# Patient Record
Sex: Female | Born: 1965 | Race: White | Hispanic: No | Marital: Married | Smoking: Former smoker
Health system: Southern US, Community
[De-identification: ages and names within clinical notes are randomized; demographics above are authoritative.]

## PROBLEM LIST (undated history)

## (undated) DIAGNOSIS — Z1211 Encounter for screening for malignant neoplasm of colon: Secondary | ICD-10-CM

## (undated) DIAGNOSIS — E669 Obesity, unspecified: Secondary | ICD-10-CM

## (undated) DIAGNOSIS — IMO0001 Reserved for inherently not codable concepts without codable children: Secondary | ICD-10-CM

## (undated) DIAGNOSIS — R87619 Unspecified abnormal cytological findings in specimens from cervix uteri: Secondary | ICD-10-CM

## (undated) DIAGNOSIS — I10 Essential (primary) hypertension: Secondary | ICD-10-CM

## (undated) DIAGNOSIS — Z1239 Encounter for other screening for malignant neoplasm of breast: Secondary | ICD-10-CM

## (undated) DIAGNOSIS — R42 Dizziness and giddiness: Secondary | ICD-10-CM

## (undated) DIAGNOSIS — E538 Deficiency of other specified B group vitamins: Secondary | ICD-10-CM

## (undated) DIAGNOSIS — E559 Vitamin D deficiency, unspecified: Secondary | ICD-10-CM

## (undated) DIAGNOSIS — M1990 Unspecified osteoarthritis, unspecified site: Secondary | ICD-10-CM

## (undated) DIAGNOSIS — J343 Hypertrophy of nasal turbinates: Secondary | ICD-10-CM

## (undated) DIAGNOSIS — R030 Elevated blood-pressure reading, without diagnosis of hypertension: Secondary | ICD-10-CM

## (undated) DIAGNOSIS — M5412 Radiculopathy, cervical region: Secondary | ICD-10-CM

## (undated) HISTORY — DX: Reserved for inherently not codable concepts without codable children: IMO0001

## (undated) HISTORY — DX: Encounter for screening for malignant neoplasm of colon: Z12.11

## (undated) HISTORY — PX: WISDOM TOOTH EXTRACTION: SHX21

## (undated) HISTORY — DX: Encounter for other screening for malignant neoplasm of breast: Z12.39

## (undated) HISTORY — DX: Radiculopathy, cervical region: M54.12

## (undated) HISTORY — DX: Unspecified abnormal cytological findings in specimens from cervix uteri: R87.619

## (undated) HISTORY — DX: Hypertrophy of nasal turbinates: J34.3

## (undated) HISTORY — DX: Obesity, unspecified: E66.9

## (undated) HISTORY — DX: Deficiency of other specified B group vitamins: E53.8

## (undated) HISTORY — DX: Essential (primary) hypertension: I10

## (undated) HISTORY — DX: Elevated blood-pressure reading, without diagnosis of hypertension: R03.0

## (undated) HISTORY — DX: Vitamin D deficiency, unspecified: E55.9

---

## 1987-03-24 HISTORY — PX: DILATION AND CURETTAGE OF UTERUS: SHX78

## 1993-03-23 HISTORY — PX: TONSILLECTOMY: SHX5217

## 1995-03-24 HISTORY — PX: HAND SURGERY: SHX662

## 2004-03-18 ENCOUNTER — Other Ambulatory Visit: Admission: RE | Admit: 2004-03-18 | Discharge: 2004-03-18 | Payer: Self-pay | Admitting: Family Medicine

## 2005-03-30 ENCOUNTER — Other Ambulatory Visit: Admission: RE | Admit: 2005-03-30 | Discharge: 2005-03-30 | Payer: Self-pay | Admitting: Family Medicine

## 2006-04-01 ENCOUNTER — Other Ambulatory Visit: Admission: RE | Admit: 2006-04-01 | Discharge: 2006-04-01 | Payer: Self-pay | Admitting: Family Medicine

## 2006-04-26 ENCOUNTER — Encounter: Admission: RE | Admit: 2006-04-26 | Discharge: 2006-04-26 | Payer: Self-pay | Admitting: Family Medicine

## 2007-05-02 ENCOUNTER — Encounter: Admission: RE | Admit: 2007-05-02 | Discharge: 2007-05-02 | Payer: Self-pay | Admitting: Family Medicine

## 2007-05-13 ENCOUNTER — Encounter: Admission: RE | Admit: 2007-05-13 | Discharge: 2007-05-13 | Payer: Self-pay | Admitting: Family Medicine

## 2010-04-13 ENCOUNTER — Encounter: Payer: Self-pay | Admitting: Family Medicine

## 2011-05-22 ENCOUNTER — Other Ambulatory Visit (HOSPITAL_COMMUNITY)
Admission: RE | Admit: 2011-05-22 | Discharge: 2011-05-22 | Disposition: A | Discharge status: Home / Self Care | Payer: 59 | Source: Ambulatory Visit | Attending: Family Medicine | Admitting: Family Medicine

## 2011-05-22 ENCOUNTER — Other Ambulatory Visit: Payer: Self-pay | Admitting: Family Medicine

## 2011-05-22 DIAGNOSIS — Z124 Encounter for screening for malignant neoplasm of cervix: Secondary | ICD-10-CM | POA: Insufficient documentation

## 2013-08-07 ENCOUNTER — Ambulatory Visit: Payer: Self-pay

## 2013-11-03 ENCOUNTER — Ambulatory Visit: Payer: Self-pay | Admitting: Family Medicine

## 2013-11-03 LAB — HM MAMMOGRAPHY

## 2014-03-08 ENCOUNTER — Ambulatory Visit: Payer: Self-pay | Admitting: Family Medicine

## 2014-03-20 ENCOUNTER — Ambulatory Visit: Payer: Self-pay | Admitting: Family Medicine

## 2014-03-22 ENCOUNTER — Ambulatory Visit: Payer: Self-pay | Admitting: Family Medicine

## 2014-05-25 ENCOUNTER — Ambulatory Visit: Payer: Self-pay | Admitting: Neurosurgery

## 2014-06-19 ENCOUNTER — Ambulatory Visit: Payer: Self-pay | Admitting: General Surgery

## 2014-06-25 ENCOUNTER — Ambulatory Visit: Payer: Self-pay | Admitting: General Surgery

## 2014-07-13 LAB — HM PAP SMEAR

## 2014-07-20 ENCOUNTER — Ambulatory Visit
Admit: 2014-07-20 | Disposition: A | Discharge status: Home / Self Care | Payer: Self-pay | Attending: Family Medicine | Admitting: Family Medicine

## 2014-08-01 ENCOUNTER — Other Ambulatory Visit: Payer: Self-pay | Admitting: Family Medicine

## 2014-08-01 DIAGNOSIS — R1011 Right upper quadrant pain: Secondary | ICD-10-CM

## 2014-08-01 DIAGNOSIS — R110 Nausea: Secondary | ICD-10-CM

## 2014-08-09 ENCOUNTER — Ambulatory Visit
Admission: RE | Admit: 2014-08-09 | Discharge: 2014-08-09 | Disposition: A | Discharge status: Home / Self Care | Payer: 59 | Source: Ambulatory Visit | Attending: Family Medicine | Admitting: Family Medicine

## 2014-08-09 DIAGNOSIS — R110 Nausea: Secondary | ICD-10-CM

## 2014-08-09 DIAGNOSIS — R1011 Right upper quadrant pain: Secondary | ICD-10-CM | POA: Diagnosis present

## 2014-08-09 HISTORY — DX: Essential (primary) hypertension: I10

## 2014-08-09 MED ORDER — SINCALIDE 5 MCG IJ SOLR
0.0200 ug/kg | Freq: Once | INTRAMUSCULAR | Status: AC
  Administered 2014-08-09: 1.7 ug via INTRAVENOUS

## 2014-08-09 MED ORDER — TECHNETIUM TC 99M MEBROFENIN IV KIT
5.0000 | PACK | Freq: Once | INTRAVENOUS | Status: AC | PRN
  Administered 2014-08-09: 4.89 via INTRAVENOUS

## 2014-08-22 ENCOUNTER — Encounter: Payer: Self-pay | Admitting: *Deleted

## 2014-09-29 ENCOUNTER — Other Ambulatory Visit: Payer: Self-pay | Admitting: Family Medicine

## 2014-12-17 ENCOUNTER — Telehealth: Payer: Self-pay

## 2014-12-17 DIAGNOSIS — Z1239 Encounter for other screening for malignant neoplasm of breast: Secondary | ICD-10-CM

## 2014-12-17 HISTORY — DX: Encounter for other screening for malignant neoplasm of breast: Z12.39

## 2014-12-17 NOTE — Telephone Encounter (Signed)
Her mammo and CPE have gotten off track. Her last CPE was back in April, but she is due for her mammogram now. Can we go ahead and order it for her?

## 2014-12-17 NOTE — Telephone Encounter (Signed)
Ordered thank you

## 2014-12-17 NOTE — Telephone Encounter (Signed)
Patient will call to sch appt

## 2014-12-21 DIAGNOSIS — E538 Deficiency of other specified B group vitamins: Secondary | ICD-10-CM | POA: Insufficient documentation

## 2014-12-21 DIAGNOSIS — IMO0001 Reserved for inherently not codable concepts without codable children: Secondary | ICD-10-CM | POA: Insufficient documentation

## 2014-12-21 DIAGNOSIS — E559 Vitamin D deficiency, unspecified: Secondary | ICD-10-CM | POA: Insufficient documentation

## 2014-12-21 DIAGNOSIS — R87619 Unspecified abnormal cytological findings in specimens from cervix uteri: Secondary | ICD-10-CM | POA: Insufficient documentation

## 2014-12-21 DIAGNOSIS — M5412 Radiculopathy, cervical region: Secondary | ICD-10-CM | POA: Insufficient documentation

## 2014-12-21 DIAGNOSIS — E669 Obesity, unspecified: Secondary | ICD-10-CM | POA: Insufficient documentation

## 2014-12-21 DIAGNOSIS — R030 Elevated blood-pressure reading, without diagnosis of hypertension: Secondary | ICD-10-CM

## 2014-12-25 ENCOUNTER — Ambulatory Visit (INDEPENDENT_AMBULATORY_CARE_PROVIDER_SITE_OTHER): Payer: 59 | Admitting: Family Medicine

## 2014-12-25 ENCOUNTER — Ambulatory Visit
Admission: RE | Admit: 2014-12-25 | Discharge: 2014-12-25 | Disposition: A | Discharge status: Home / Self Care | Payer: 59 | Source: Ambulatory Visit | Attending: Family Medicine | Admitting: Family Medicine

## 2014-12-25 ENCOUNTER — Encounter: Payer: Self-pay | Admitting: Family Medicine

## 2014-12-25 VITALS — BP 116/74 | HR 72 | Temp 99.1°F | Ht 63.5 in | Wt 186.8 lb

## 2014-12-25 DIAGNOSIS — Z1231 Encounter for screening mammogram for malignant neoplasm of breast: Secondary | ICD-10-CM | POA: Diagnosis present

## 2014-12-25 DIAGNOSIS — Z23 Encounter for immunization: Secondary | ICD-10-CM | POA: Diagnosis not present

## 2014-12-25 DIAGNOSIS — E669 Obesity, unspecified: Secondary | ICD-10-CM

## 2014-12-25 DIAGNOSIS — Z1239 Encounter for other screening for malignant neoplasm of breast: Secondary | ICD-10-CM

## 2014-12-25 NOTE — Assessment & Plan Note (Addendum)
Praised patient for her efforts at working out, trying to eat healthier; she is making good progress; counseling given on weight loss, safe rate of weight loss for her should be 1/2 to 1 pound every week; use information on familydoctor.org, "What It Takes to Lose Weight"; calorie restriction, activity; slow and steady; estimated her weight to get her out of the obesity category is 171 pounds or just under, and if she loses just 1/2 pound per week, she should reach that target in 32 weeks; her ideal weight is 143 pounds or just under

## 2014-12-25 NOTE — Patient Instructions (Signed)
Check out the information at familydoctor.org entitled "What It Takes to Lose Weight" Try to lose between 1-2 pounds per week by taking in fewer calories and burning off more calories You can succeed by limiting portions, limiting foods dense in calories and fat, becoming more active, and drinking 8 glasses of water a day Don't skip meals, especially breakfast, as skipping meals may alter your metabolism Do not use over-the-counter weight loss pills or gimmicks that claim rapid weight loss A healthy BMI (or body mass index) is between 18.5 and 24.9 You can calculate your ideal BMI at the [place] website [employer]fr Your ideal target weight is 143 pounds or just under Your target weight to get out of the obese category is 171 pounds or just under Keep up the great effort at eating healthier

## 2014-12-25 NOTE — Progress Notes (Signed)
BP 116/74 mmHg  Pulse 72  Temp(Src) 99.1 F (37.3 C)  Ht 5' 3.5" (1.613 m)  Wt 186 lb 12.8 oz (84.732 kg)  BMI 32.57 kg/m2  SpO2 100%   Subjective:    Patient ID: Wendy Li, female    DOB: 13-Aug-1965, 49 y.o.   MRN: [id number]  HPI: Wendy Li is a 49 y.o. female  Chief Complaint  Patient presents with  . Follow-up    Form to fill out for work for BMI for insurance for [facility]   She has lost down from 190 pounds to 186+ pounds today, a little over 3 pounds since her last visit She is really working on her weight and physical fitness; she wants to get a form filled out so her insurance is aware she is working on weight loss Going to the gym four days a week at least, sometimes goes five days a week She is feeling better and her back feels better Noticing muscles in her arms She says she has to be less than 30.0 BMI; she was measured at 31.8, down from 35 last year; they did her waist measurements too She is 32.57 here; she verifies that we checked her height without her shoes on They checked her blood too, glucose, A1C, cholesterol She did not have surgery or a thyroid condition She has been focusing on nutrition, eating healthier She just started working out in July She was slow going out of the gate, not 4-5 x a week, just 2-3 x a week Now doesn't have the soreness and runs the mile and does weight training, in there about an hour Eating lots of spinach, leafy greens, broccoli Cut down on tuna after our talk about mercury poisoning Side note: thinks she had a kidney stone; that's all better now; thinks she passed a kidney stone on the right side  Relevant past medical, surgical, family and social history reviewed and updated as indicated. Interim medical history since our last visit reviewed. Allergies and medications reviewed and updated.  Review of Systems  Constitutional: Negative for unexpected weight change.  Cardiovascular: Negative for  chest pain.  Endocrine: Negative for polydipsia and polyuria.  Musculoskeletal:       Little bit of tendonitis in right elbow, uses ibuprofen; feet don't hurt anymore  Neurological: Negative for dizziness.  Hematological: Does not bruise/bleed easily.   Per HPI unless specifically indicated above     Objective:    BP 116/74 mmHg  Pulse 72  Temp(Src) 99.1 F (37.3 C)  Ht 5' 3.5" (1.613 m)  Wt 186 lb 12.8 oz (84.732 kg)  BMI 32.57 kg/m2  SpO2 100%  Wt Readings from Last 3 Encounters:  12/25/14 186 lb 12.8 oz (84.732 kg)  07/13/14 190 lb (86.183 kg)  08/09/14 190 lb (86.183 kg)    Physical Exam  Constitutional: She appears well-developed and well-nourished.  Cardiovascular: Normal rate and regular rhythm.   Pulmonary/Chest: Effort normal and breath sounds normal.  Abdominal: Soft. Bowel sounds are normal. She exhibits no distension.  Musculoskeletal: She exhibits no edema.  Skin: Skin is warm and dry.  Psychiatric: She has a normal mood and affect. Her behavior is normal. Judgment and thought content normal.    Results for orders placed or performed in visit on 12/21/14  HM MAMMOGRAPHY  Result Value Ref Range   HM Mammogram per PP   HM PAP SMEAR  Result Value Ref Range   HM Pap smear per PP  Assessment & Plan:   Problem List Items Addressed This Visit      Other   Obesity - Primary    Praised patient for her efforts at working out, trying to eat healthier; she is making good progress; counseling given on weight loss, safe rate of weight loss for her should be 1/2 to 1 pound every week; use information on familydoctor.org, "What It Takes to Lose Weight"; calorie restriction, activity; slow and steady; estimated her weight to get her out of the obesity category is 171 pounds or just under, and if she loses just 1/2 pound per week, she should reach that target in 32 weeks; her ideal weight is 143 pounds or just under       Other Visit Diagnoses    Need for  influenza vaccination        flu vaccine offered and given today    Relevant Orders    Flu Vaccine QUAD 36+ mos PF IM (Fluarix & Fluzone Quad PF) (Completed)       Follow up plan: Return if symptoms worsen or fail to improve.  Face-to-face time with patient was more than 10 minutes, >50% time spent counseling and coordination of care

## 2015-03-28 ENCOUNTER — Other Ambulatory Visit: Payer: Self-pay | Admitting: Family Medicine

## 2015-03-28 NOTE — Telephone Encounter (Signed)
April 2016 creatinine and -lytes reviewed; rx approved

## 2015-07-11 ENCOUNTER — Encounter: Payer: Self-pay | Admitting: Emergency Medicine

## 2015-07-11 ENCOUNTER — Ambulatory Visit
Admission: EM | Admit: 2015-07-11 | Discharge: 2015-07-11 | Disposition: A | Discharge status: Home / Self Care | Payer: 59 | Attending: Family Medicine | Admitting: Family Medicine

## 2015-07-11 DIAGNOSIS — W57XXXA Bitten or stung by nonvenomous insect and other nonvenomous arthropods, initial encounter: Secondary | ICD-10-CM

## 2015-07-11 DIAGNOSIS — T148 Other injury of unspecified body region: Secondary | ICD-10-CM

## 2015-07-11 DIAGNOSIS — J0110 Acute frontal sinusitis, unspecified: Secondary | ICD-10-CM | POA: Diagnosis not present

## 2015-07-11 DIAGNOSIS — B349 Viral infection, unspecified: Secondary | ICD-10-CM

## 2015-07-11 LAB — RAPID STREP SCREEN (MED CTR MEBANE ONLY): STREPTOCOCCUS, GROUP A SCREEN (DIRECT): NEGATIVE

## 2015-07-11 LAB — RAPID INFLUENZA A&B ANTIGENS
Influenza A (ARMC): NEGATIVE
Influenza B (ARMC): NEGATIVE

## 2015-07-11 MED ORDER — HYDROCOD POLST-CPM POLST ER 10-8 MG/5ML PO SUER: 5.0000 mL | Freq: Every evening | ORAL | Status: DC | PRN

## 2015-07-11 MED ORDER — BENZONATATE 100 MG PO CAPS: 100.0000 mg | ORAL_CAPSULE | Freq: Three times a day (TID) | ORAL | Status: DC | PRN

## 2015-07-11 MED ORDER — ACETAMINOPHEN 500 MG PO TABS
1000.0000 mg | ORAL_TABLET | Freq: Once | ORAL | Status: AC
  Administered 2015-07-11: 1000 mg via ORAL

## 2015-07-11 MED ORDER — AMOXICILLIN-POT CLAVULANATE 875-125 MG PO TABS: 1.0000 | ORAL_TABLET | Freq: Two times a day (BID) | ORAL | Status: DC

## 2015-07-11 NOTE — Discharge Instructions (Signed)
Take medication as prescribed. Rest. Drink plenty of fluids. Take over the counter tylenol or ibuprofen as needed for pain or fever.  Follow up with your primary care physician this week as needed. Return to Urgent care or ER for new or worsening concerns.    Sinusitis, Adult Sinusitis is redness, soreness, and puffiness (inflammation) of the air pockets in the bones of your face (sinuses). The redness, soreness, and puffiness can cause air and mucus to get trapped in your sinuses. This can allow germs to grow and cause an infection.  HOME CARE   Drink enough fluids to keep your pee (urine) clear or pale yellow.  Use a humidifier in your home.  Run a hot shower to create steam in the bathroom. Sit in the bathroom with the door closed. Breathe in the steam 3-4 times a day.  Put a warm, moist washcloth on your face 3-4 times a day, or as told by your doctor.  Use salt water sprays (saline sprays) to wet the thick fluid in your nose. This can help the sinuses drain.  Only take medicine as told by your doctor. GET HELP RIGHT AWAY IF:   Your pain gets worse.  You have very bad headaches.  You are sick to your stomach (nauseous).  You throw up (vomit).  You are very sleepy (drowsy) all the time.  Your face is puffy (swollen).  Your vision changes.  You have a stiff neck.  You have trouble breathing. MAKE SURE YOU:   Understand these instructions.  Will watch your condition.  Will get help right away if you are not doing well or get worse.   This information is not intended to replace advice given to you by your health care provider. Make sure you discuss any questions you have with your health care provider.   Document Released: 08/26/2007 Document Revised: 03/30/2014 Document Reviewed: 10/13/2011 Elsevier Interactive Patient Education 2016 [employer] Ticks are insects that attach themselves to the skin. There are many types of ticks. Common  types include wood ticks and deer ticks. Sometimes, ticks carry diseases that can make a person very ill. The most common places for ticks to attach themselves are the scalp, neck, armpits, waist, and groin.  HOW CAN YOU PREVENT TICK BITES? Take these steps to help prevent tick bites when you are outdoors:  Wear long sleeves and long pants.  Wear white clothes so you can see ticks more easily.  Tuck your pant legs into your socks.  If walking on a trail, stay in the middle of the trail to avoid brushing against bushes.  Avoid walking through areas with long grass.  Put bug spray on all skin that is showing and along boot tops, pant legs, and sleeve cuffs.  Check clothes, hair, and skin often and before going inside.  Brush off any ticks that are not attached.  Take a shower or bath as soon as possible after being outdoors. HOW SHOULD YOU REMOVE A TICK? Ticks should be removed as soon as possible to help prevent diseases.  If latex gloves are available, put them on before trying to remove a tick.  Use tweezers to grasp the tick as close to the skin as possible. You may also use curved forceps or a tick removal tool. Grasp the tick as close to its head as possible. Avoid grasping the tick on its body.  Pull gently upward until the tick lets go. Do not twist the tick or  jerk it suddenly. This may break off the tick's head or mouth parts.  Do not squeeze or crush the tick's body. This could force disease-carrying fluids from the tick into your body.  After the tick is removed, wash the bite area and your hands with soap and water or alcohol.  Apply a small amount of antiseptic cream or ointment to the bite site.  Wash any tools that were used. Do not try to remove a tick by applying a hot match, petroleum jelly, or fingernail polish to the tick. These methods do not work. They may also increase the chances of disease being spread from the tick bite. WHEN SHOULD YOU SEEK  HELP? Contact your health care provider if you are unable to remove a tick or if a part of the tick breaks off in the skin. After a tick bite, you need to watch for signs and symptoms of diseases that can be spread by ticks. Contact your health care provider if you develop any of the following:  Fever.  Rash.  Redness and puffiness (swelling) in the area of the tick bite.  Tender, puffy lymph glands.  Watery poop (diarrhea).  Weight loss.  Cough.  Feeling more tired than normal (fatigue).  Muscle, joint, or bone pain.  Belly (abdominal) pain.  Headache.  Change in your level of consciousness.  Trouble walking or moving your legs.  Loss of feeling (numbness) in the legs.  Loss of movement (paralysis).  Shortness of breath.  Confusion.  Throwing up (vomiting) many times.   This information is not intended to replace advice given to you by your health care provider. Make sure you discuss any questions you have with your health care provider.   Document Released: 06/03/2009 Document Revised: 11/09/2012 Document Reviewed: 08/17/2012 Elsevier Interactive Patient Education 2016 Elsevier Inc.  Viral Infections A viral infection can be caused by different types of viruses.Most viral infections are not serious and resolve on their own. However, some infections may cause severe symptoms and may lead to further complications. SYMPTOMS Viruses can frequently cause:  Minor sore throat.  Aches and pains.  Headaches.  Runny nose.  Different types of rashes.  Watery eyes.  Tiredness.  Cough.  Loss of appetite.  Gastrointestinal infections, resulting in nausea, vomiting, and diarrhea. These symptoms do not respond to antibiotics because the infection is not caused by bacteria. However, you might catch a bacterial infection following the viral infection. This is sometimes called a "superinfection." Symptoms of such a bacterial infection may include:  Worsening  sore throat with pus and difficulty swallowing.  Swollen neck glands.  Chills and a high or persistent fever.  Severe headache.  Tenderness over the sinuses.  Persistent overall ill feeling (malaise), muscle aches, and tiredness (fatigue).  Persistent cough.  Yellow, green, or brown mucus production with coughing. HOME CARE INSTRUCTIONS   Only take over-the-counter or prescription medicines for pain, discomfort, diarrhea, or fever as directed by your caregiver.  Drink enough water and fluids to keep your urine clear or pale yellow. Sports drinks can provide valuable electrolytes, sugars, and hydration.  Get plenty of rest and maintain proper nutrition. Soups and broths with crackers or rice are fine. SEEK IMMEDIATE MEDICAL CARE IF:   You have severe headaches, shortness of breath, chest pain, neck pain, or an unusual rash.  You have uncontrolled vomiting, diarrhea, or you are unable to keep down fluids.  You or your child has an oral temperature above 102 F (38.9 C), not controlled  by medicine.  Your baby is older than 3 months with a rectal temperature of 102 F (38.9 C) or higher.  Your baby is 58 months old or younger with a rectal temperature of 100.4 F (38 C) or higher. MAKE SURE YOU:   Understand these instructions.  Will watch your condition.  Will get help right away if you are not doing well or get worse.   This information is not intended to replace advice given to you by your health care provider. Make sure you discuss any questions you have with your health care provider.   Document Released: 12/17/2004 Document Revised: 06/01/2011 Document Reviewed: 08/15/2014 Elsevier Interactive Patient Education [employer].

## 2015-07-11 NOTE — ED Provider Notes (Signed)
[facility]  ____________________________________________  Time seen: Approximately 12:41 PM  I have reviewed the triage vital signs and the nursing notes.   HISTORY  Chief Complaint Facial Pain and Otalgia   HPI Wendy Li is a 50 y.o. female presents with complaints of 2 days of runny nose, nasal congestion and dry hacking cough. States mild scratchy sore throat. Reports some generalized body aches. States clear rhinorrhea. States cough is a nonproductive hacking cough. Reports continues to drink fluids well. Reports that she felt like she had a fever yesterday but did not measure it at home. Patient reports that she feels like she has a sinus infection as she has pressure in her sinuses and her sinuses feel full and clogged. States left ear also feels clogged.  Reports she has to travel a lot for work and states this past Sunday she was around someone that was sick and coughing. Denies other known sick contacts. Reports has not taken any medications for these complaints today.  Patient also reports that this past Tuesday that she did have a small tick on her right abdomen. Patient states that she pulled out the tick. Denies any rash around the area but reports that the area is slightly red. Denies drainage or swelling.States was mildly itchy.   Denies chest pain, shortness of breath, chest pain with deep breath, abdominal pain, dysuria, chest pain with deep breath, extremity pain or extremity swelling.  PCP: Lada   No LMP recorded. Patient has had an implant. States IUD and doesn't have regular menstruals. Denies chance of pregnancy.     Past Medical History  Diagnosis Date  . Hypertension   . Elevated blood pressure   . Obesity   . Cervical radiculopathy at C6   . Vitamin D deficiency disease   . Vitamin B12 deficiency   . Abnormal Pap smear of cervix 1994-2002    ASCUS, colposcopy, LEEP procedure-no abnormals since    Patient Active Problem List   Diagnosis Date Noted  . Elevated blood pressure   . Obesity   . Cervical radiculopathy at C6   . Vitamin D deficiency disease   . Vitamin B12 deficiency   . Abnormal Pap smear of cervix   . Breast cancer screening 12/17/2014    Past Surgical History  Procedure Laterality Date  . Dilation and curettage of uterus  1989  . Tonsillectomy  1995  . Hand surgery  1997    cyst removal    Current Outpatient Rx  Name  Route  Sig  Dispense  Refill  . Cholecalciferol (VITAMIN D) 2000 UNITS CAPS   Oral   Take 2,000 Units by mouth once a week.         . hydrochlorothiazide (HYDRODIURIL) 25 MG tablet      Take 1 tablet by mouth  daily   90 tablet   0     Allergies Erythromycin  Family History  Problem Relation Age of Onset  . Cancer Mother   . Hyperlipidemia Mother   . Hypertension Mother   . Atrial fibrillation Father   . Heart disease Father     angioplasty  . Hyperlipidemia Father   . Thyroid disease Father   . Arthritis Sister   . Hypertension Maternal Grandmother   . Blindness Maternal Grandmother   . Cancer Paternal Grandfather     testicular    Social History Social History  Substance Use Topics  . Smoking status: Former Research scientist (life sciences)  . Smokeless tobacco: Never Used  .  Alcohol Use: No    Review of Systems Constitutional:As above Eyes: No visual changes. ENT: Positive runny nose, nasal congestion and cough. Cardiovascular: Denies chest pain. Respiratory: Denies shortness of breath. Gastrointestinal: No abdominal pain.  No nausea, no vomiting.  No diarrhea.  No constipation. Genitourinary: Negative for dysuria. Musculoskeletal: Negative for back pain. Skin: Negative for rash. Positive red area to right abdomen from recent tick bite Neurological: Negative for headaches, focal weakness or numbness.  10-point ROS otherwise negative.  ____________________________________________   PHYSICAL EXAM:  VITAL SIGNS: ED Triage Vitals  Enc Vitals Group     BP  07/11/15 1221 134/83 mmHg     Pulse Rate 07/11/15 1221 92     Resp 07/11/15 1221 16     Temp 07/11/15 1221 100.8 F (38.2 C)     Temp Source 07/11/15 1221 Tympanic     SpO2 07/11/15 1221 100 %     Weight 07/11/15 1221 185 lb (83.915 kg)     Height 07/11/15 1221 5\' 3"  (1.6 m)     Head Cir --      Peak Flow --      Pain Score 07/11/15 1223 3     Pain Loc --      Pain Edu? --      Excl. in [place]? --    Today's Vitals   07/11/15 1221 07/11/15 1223 07/11/15 1430  BP: 134/83    Pulse: 92    Temp: 100.8 F (38.2 C)  99.5 F (37.5 C)  TempSrc: Tympanic  Tympanic  Resp: 16    Height: 5\' 3"  (1.6 m)    Weight: 185 lb (83.915 kg)    SpO2: 100%    PainSc:  3  2      Constitutional: Alert and oriented. Well appearing and in no acute distress. Eyes: Conjunctivae are normal. PERRL. EOMI. Head: Atraumatic. Mild tenderness to palpation bilateral frontal and maxillary sinuses. No swelling. No erythema.  Ears: no erythema, normal TMs bilaterally.   Nose:Nasal congestion with clear rhinorrhea  Mouth/Throat: Mucous membranes are moist. Mild pharyngeal erythema. Tonsils surgically absent. No exudate. Neck: No stridor.  No cervical spine tenderness to palpation. Hematological/Lymphatic/Immunilogical: No cervical lymphadenopathy. Cardiovascular: Normal rate, regular rhythm. Grossly normal heart sounds.  Good peripheral circulation. Respiratory: Normal respiratory effort.  No retractions. Lungs CTAB.No wheezes, rales or rhonchi. Good air movement.  Gastrointestinal: Soft and nontender. Normal Bowel sounds. No CVA tenderness. Musculoskeletal: No lower or upper extremity tenderness nor edema. No cervical, thoracic or lumbar tenderness to palpation. No calf tenderness bilaterally. Neurologic:  Normal speech and language. No gross focal neurologic deficits are appreciated. No gait instability. Skin:  Skin is warm, dry and intact. No rash noted. Except: Right anterior lateral abdomen less than 0.5 cm  area of circular mild erythema with central punctum, nontender, no surrounding erythema, no fluctuance or induration, no other rash noted. No appearance of foreign body noted. Psychiatric: Mood and affect are normal. Speech and behavior are normal.  ____________________________________________   LABS (all labs ordered are listed, but only abnormal results are displayed)  Labs Reviewed  RAPID INFLUENZA A&B ANTIGENS ([facility] ONLY)  RAPID STREP SCREEN (NOT AT [facility])  CULTURE, GROUP A STREP ([facility])  B. BURGDORFI ANTIBODIES  ROCKY MTN SPOTTED FVR ABS PNL(IGG+IGM)   ____________________________________________   INITIAL IMPRESSION / ASSESSMENT AND PLAN / ED COURSE  Pertinent labs & imaging results that were available during my care of the patient were reviewed by me and considered in my medical decision  making (see chart for details).  Overall well-appearing patient. Presenting for 2 days of runny nose, nasal congestion and cough with associated body aches and fever. Reports recently around someone with similar when traveling for work. Also reports tick bite recently in which she pulled off of her abdomen 2 days ago, states unsure exactly how long tick was attached. Lungs clear throughout. Abdomen soft and nontender. Site of tick bite does not appear to be cellulitis but local reaction from recent bite. Some sinus pressure and clear rhinorrhea. Suspect viral illness such as influenza. Will evaluate for influenza. Tylenol 1 g orally once in urgent care. Will also evaluate for RMSF and lymes.   Suspect viral illness. Influenza negative. Awaiting lyme and RMSF labs.Will treat supportively with prn tessalon perles during the day, and When necessary Tussionex at night. Encouraged keeping tick bite clean and applying topical over-the-counter antibiotic ointment. Encouraged rest, fluids, over-the-counter Tylenol and ibuprofen as needed. Patient expressed concern regarding need for antibiotic due to concern  she has a sinus infection and reports history of being prone to sinus infections. Prescription given for oral Augmentin given and encouraged patient to hold 3 more days and then start as needed for continued symptoms. Patient agreed to this plan. Discussed indication, risks and benefits of medications with patient.  Discussed follow up with Primary care physician this week. Discussed follow up and return parameters including no resolution or any worsening concerns. Patient verbalized understanding and agreed to plan.   ____________________________________________   FINAL CLINICAL IMPRESSION(S) / ED DIAGNOSES  Final diagnoses:  Viral illness  Tick bite  Acute frontal sinusitis, recurrence not specified      Note: This dictation was prepared with Dragon dictation along with smaller phrase technology. Any transcriptional errors that result from this process are unintentional.    Marylene Land, NP 07/11/15 1627

## 2015-07-11 NOTE — ED Notes (Signed)
Patient c/o sinus pain and pressure, nasal congestion and left ear pain since last Tuesday night.

## 2015-07-12 LAB — ROCKY MTN SPOTTED FVR ABS PNL(IGG+IGM)
RMSF IGM: 0.29 {index} (ref 0.00–0.89)
RMSF IgG: NEGATIVE

## 2015-07-12 LAB — B. BURGDORFI ANTIBODIES: B burgdorferi Ab IgG+IgM: <0.91 {ISR} (ref 0.00–0.90)

## 2015-07-13 LAB — CULTURE, GROUP A STREP (THRC)

## 2015-07-16 ENCOUNTER — Telehealth: Payer: Self-pay | Admitting: *Deleted

## 2015-07-16 NOTE — ED Notes (Signed)
Called patient and informed her that all of her test results came back negative. Patient confirmed understanding of information.

## 2015-07-19 ENCOUNTER — Encounter: Payer: Self-pay | Admitting: Family Medicine

## 2015-07-19 ENCOUNTER — Ambulatory Visit (INDEPENDENT_AMBULATORY_CARE_PROVIDER_SITE_OTHER): Payer: 59 | Admitting: Family Medicine

## 2015-07-19 VITALS — BP 132/80 | HR 88 | Temp 98.5°F | Resp 14 | Ht 63.0 in | Wt 199.0 lb

## 2015-07-19 DIAGNOSIS — E559 Vitamin D deficiency, unspecified: Secondary | ICD-10-CM | POA: Diagnosis not present

## 2015-07-19 DIAGNOSIS — R87610 Atypical squamous cells of undetermined significance on cytologic smear of cervix (ASC-US): Secondary | ICD-10-CM

## 2015-07-19 DIAGNOSIS — Z0000 Encounter for general adult medical examination without abnormal findings: Secondary | ICD-10-CM | POA: Diagnosis not present

## 2015-07-19 DIAGNOSIS — Z114 Encounter for screening for human immunodeficiency virus [HIV]: Secondary | ICD-10-CM | POA: Diagnosis not present

## 2015-07-19 DIAGNOSIS — E538 Deficiency of other specified B group vitamins: Secondary | ICD-10-CM | POA: Diagnosis not present

## 2015-07-19 DIAGNOSIS — E669 Obesity, unspecified: Secondary | ICD-10-CM | POA: Diagnosis not present

## 2015-07-19 DIAGNOSIS — Z1239 Encounter for other screening for malignant neoplasm of breast: Secondary | ICD-10-CM | POA: Diagnosis not present

## 2015-07-19 NOTE — Progress Notes (Signed)
Patient ID: [name], female   DOB: [birth year], 50 y.o.   MRN: [id number]   Subjective:   Wendy Li is a 50 y.o. female here for a complete physical exam  Interim issues since last visit: on augmentin, sinus infection  USPSTF grade A and B recommendations Alcohol: one beer in [place], rare Depression:  Depression screen [facility] 2/9 07/19/2015  Decreased Interest 0  Down, Depressed, Hopeless 0  PHQ - 2 Score 0   Hypertension: little up, not feeling well, no trips to gym, stuff going on, pulled muscle in back Obesity: she'll work on that Tobacco use: former smoker; quit 20 years ago HIV, hep B, hep C: hiv ordered, others n/a STD testing and prevention (chl/gon/syphilis): n/a Lipids: today Glucose: today Colorectal cancer: at age 75 Breast cancer: yearly mammo BRCA gene screening: no breast or ovarian cancer Intimate partner violence: no Cervical cancer screening: done April 2016 Lung cancer: n/a Osteoporosis: n/a; mother has osteoporosis Fall prevention/vitamin D: vit D AAA: n/a Aspirin: n/a Diet: fairly good eater, loves veggies and salad, limiting tuna, more other fish Exercise: goes to the gym regularly, cardio, circuit, strengthening Skin cancer: no worrisome moles; mother had BCC, wears sunscreen in makeup and lotion   Past Medical History  Diagnosis Date  . Hypertension   . Elevated blood pressure   . Obesity   . Cervical radiculopathy at C6   . Vitamin D deficiency disease   . Vitamin B12 deficiency   . Abnormal Pap smear of cervix 1994-2002    ASCUS, colposcopy, LEEP procedure-no abnormals since   Past Surgical History  Procedure Laterality Date  . Dilation and curettage of uterus  1989  . Tonsillectomy  1995  . Hand surgery  1997    cyst removal   Family History  Problem Relation Age of Onset  . Cancer Mother   . Hyperlipidemia Mother   . Hypertension Mother   . Atrial fibrillation Father   . Heart disease Father    angioplasty  . Hyperlipidemia Father   . Thyroid disease Father   . Arthritis Sister   . Hypertension Maternal Grandmother   . Blindness Maternal Grandmother   . Cancer Paternal Grandfather     testicular   Social History  Substance Use Topics  . Smoking status: Former Research scientist (life sciences)  . Smokeless tobacco: Never Used  . Alcohol Use: No   Review of Systems  Constitutional: Positive for unexpected weight change.  Gastrointestinal: Negative for blood in stool.  Genitourinary: Negative for hematuria.  Hematological: Does not bruise/bleed easily.    Objective:   Filed Vitals:   07/19/15 1057  BP: 132/80  Pulse: 88  Temp: 98.5 F (36.9 C)  TempSrc: Oral  Resp: 14  Height: _0  (1.6 m)  Weight: 199 lb (90.266 kg)  SpO2: 98%   Body mass index is 35.26 kg/(m^2). Wt Readings from Last 3 Encounters:  07/19/15 199 lb (90.266 kg)  07/11/15 185 lb (83.915 kg)  12/25/14 186 lb 12.8 oz (84.732 kg)  MD note: patient was NOT weighed on 07/11/15; that was verbal estimate body mass index is 35.26 kg/(m^2).   Physical Exam  Constitutional: She appears well-developed and well-nourished.  Weight gain noted; obese  HENT:  Head: Normocephalic and atraumatic.  Right Ear: Hearing, tympanic membrane, external ear and ear canal normal.  Left Ear: Hearing, tympanic membrane, external ear and ear canal normal.  Eyes: Conjunctivae and EOM are normal. Right eye exhibits no hordeolum. Left eye exhibits no hordeolum. No  scleral icterus.  Neck: Carotid bruit is not present. No thyromegaly present.  Cardiovascular: Normal rate, regular rhythm, S1 normal, S2 normal and normal heart sounds.   No extrasystoles are present.  Pulmonary/Chest: Effort normal and breath sounds normal. No respiratory distress. Right breast exhibits no inverted nipple, no mass, no nipple discharge, no skin change and no tenderness. Left breast exhibits no inverted nipple, no mass, no nipple discharge, no skin change and no tenderness.  Breasts are symmetrical.  Abdominal: Soft. Normal appearance and bowel sounds are normal. She exhibits no distension, no abdominal bruit, no pulsatile midline mass and no mass. There is no hepatosplenomegaly. There is no tenderness. No hernia.  Genitourinary: Uterus is not deviated, not enlarged and not tender. Cervix exhibits no motion tenderness, no discharge and no friability. No tenderness or bleeding in the vagina. Vaginal discharge (watery) found.  Musculoskeletal: Normal range of motion. She exhibits no edema.  Lymphadenopathy:       Head (right side): No submandibular adenopathy present.       Head (left side): No submandibular adenopathy present.    She has no cervical adenopathy.    She has no axillary adenopathy.  Neurological: She is alert. She displays no tremor. No cranial nerve deficit. She exhibits normal muscle tone. Gait normal.  Reflex Scores:      Patellar reflexes are 2+ on the right side and 2+ on the left side. Skin: Skin is warm and dry. No bruising and no ecchymosis noted. No cyanosis. No pallor.  Psychiatric: Her speech is normal and behavior is normal. Thought content normal. Her mood appears not anxious. She does not exhibit a depressed mood.    Assessment/Plan:   Problem List Items Addressed This Visit      Digestive   Vitamin B12 deficiency    Check level today      Relevant Orders   Vitamin B12 (Completed)     Other   Breast cancer screening    CBE done today and yearly mammo      Obesity    See AVS; encouraged pt to work on weight loss      Vitamin D deficiency disease    Check level today      Relevant Orders   VITAMIN D 25 Hydroxy (Vit-D Deficiency, Fractures) (Completed)   Abnormal Pap smear of cervix    Check Pap smear today; hx of abnormals      Relevant Orders   Pap liquid-based and HPV (high risk)   Encounter for screening for HIV   Relevant Orders   HIV antibody (Completed)   Preventative health care - Primary    USPSTF  grade A and B recommendations reviewed with patient; age-appropriate recommendations, preventive care, screening tests, etc discussed and encouraged; healthy living encouraged; see AVS for patient education given to patient      Relevant Orders   Comprehensive metabolic panel   Lipid Panel w/o Chol/HDL Ratio (Completed)   TSH (Completed)   CBC with Differential/Platelet (Completed)       Orders Placed This Encounter  Procedures  . Comprehensive metabolic panel  . HIV antibody  . Lipid Panel w/o Chol/HDL Ratio  . TSH  . CBC with Differential/Platelet  . Vitamin B12  . VITAMIN D 25 Hydroxy (Vit-D Deficiency, Fractures)  . Comprehensive metabolic panel    Follow up plan: Return in about 1 year (around 07/18/2016) for complete physical. An after-visit summary was printed and given to the patient at [place].  Please see the patient  instructions which may contain other information and recommendations beyond what is mentioned above in the assessment and plan.

## 2015-07-19 NOTE — Assessment & Plan Note (Addendum)
Check Pap smear today; hx of abnormals

## 2015-07-19 NOTE — Assessment & Plan Note (Signed)
Check level today 

## 2015-07-19 NOTE — Assessment & Plan Note (Signed)
CBE done today and yearly mammo

## 2015-07-19 NOTE — Patient Instructions (Addendum)
Let's get labs today If you have not heard anything from my staff in a week about any orders/referrals/studies from today, please contact us here to follow-up [phone]  Try to lose between 1-2 pounds per week by taking in fewer calories and burning off more calories Decreasing your net calories in by 500 per day will result in one pound of fat lost each week You can succeed by limiting portions, limiting foods dense in calories and fat, becoming more active, and drinking 8 glasses of water a day (64 ounces) Don't skip meals, especially breakfast, as skipping meals may alter your metabolism Do not use over-the-counter weight loss pills or gimmicks that claim rapid weight loss A healthy BMI (or body mass index) is between 18.5 and 24.9 You can calculate your ideal BMI at the [place] website [employer]fr   Health Maintenance, Female Adopting a healthy lifestyle and getting preventive care can go a long way to promote health and wellness. Talk with your health care provider about what schedule of regular examinations is right for you. This is a good chance for you to check in with your provider about disease prevention and staying healthy. In between checkups, there are plenty of things you can do on your own. Experts have done a lot of research about which lifestyle changes and preventive measures are most likely to keep you healthy. Ask your health care provider for more information. WEIGHT AND DIET  Eat a healthy diet  Be sure to include plenty of vegetables, fruits, low-fat dairy products, and lean protein.  Do not eat a lot of foods high in solid fats, added sugars, or salt.  Get regular exercise. This is one of the most important things you can do for your health.  Most adults should exercise for at least 150 minutes each week. The exercise should increase your heart rate and make you sweat (moderate-intensity exercise).  Most  adults should also do strengthening exercises at least twice a week. This is in addition to the moderate-intensity exercise.  Maintain a healthy weight  Body mass index (BMI) is a measurement that can be used to identify possible weight problems. It estimates body fat based on height and weight. Your health care provider can help determine your BMI and help you achieve or maintain a healthy weight.  For females 26 years of age and older:   A BMI below 18.5 is considered underweight.  A BMI of 18.5 to 24.9 is normal.  A BMI of 25 to 29.9 is considered overweight.  A BMI of 30 and above is considered obese.  Watch levels of cholesterol and blood lipids  You should start having your blood tested for lipids and cholesterol at 50 years of age, then have this test every 5 years.  You may need to have your cholesterol levels checked more often if:  Your lipid or cholesterol levels are high.  You are older than 50 years of age.  You are at high risk for heart disease.  CANCER SCREENING   Lung Cancer  Lung cancer screening is recommended for adults 29-57 years old who are at high risk for lung cancer because of a history of smoking.  A yearly low-dose CT scan of the lungs is recommended for people who:  Currently smoke.  Have quit within the past 15 years.  Have at least a 30-pack-year history of smoking. A pack year is smoking an average of one pack of cigarettes a day for 1 year.  Yearly screening  should continue until it has been 15 years since you quit.  Yearly screening should stop if you develop a health problem that would prevent you from having lung cancer treatment.  Breast Cancer  Practice breast self-awareness. This means understanding how your breasts normally appear and feel.  It also means doing regular breast self-exams. Let your health care provider know about any changes, no matter how small.  If you are in your 20s or 30s, you should have a clinical  breast exam (CBE) by a health care provider every 1-3 years as part of a regular health exam.  If you are 47 or older, have a CBE every year. Also consider having a breast X-ray (mammogram) every year.  If you have a family history of breast cancer, talk to your health care provider about genetic screening.  If you are at high risk for breast cancer, talk to your health care provider about having an MRI and a mammogram every year.  Breast cancer gene (BRCA) assessment is recommended for women who have family members with BRCA-related cancers. BRCA-related cancers include:  Breast.  Ovarian.  Tubal.  Peritoneal cancers.  Results of the assessment will determine the need for genetic counseling and BRCA1 and BRCA2 testing. Cervical Cancer Your health care provider may recommend that you be screened regularly for cancer of the pelvic organs (ovaries, uterus, and vagina). This screening involves a pelvic examination, including checking for microscopic changes to the surface of your cervix (Pap test). You may be encouraged to have this screening done every 3 years, beginning at age 70.  For women ages 4-65, health care providers may recommend pelvic exams and Pap testing every 3 years, or they may recommend the Pap and pelvic exam, combined with testing for human papilloma virus (HPV), every 5 years. Some types of HPV increase your risk of cervical cancer. Testing for HPV may also be done on women of any age with unclear Pap test results.  Other health care providers may not recommend any screening for nonpregnant women who are considered low risk for pelvic cancer and who do not have symptoms. Ask your health care provider if a screening pelvic exam is right for you.  If you have had past treatment for cervical cancer or a condition that could lead to cancer, you need Pap tests and screening for cancer for at least 20 years after your treatment. If Pap tests have been discontinued, your risk  factors (such as having a new sexual partner) need to be reassessed to determine if screening should resume. Some women have medical problems that increase the chance of getting cervical cancer. In these cases, your health care provider may recommend more frequent screening and Pap tests. Colorectal Cancer  This type of cancer can be detected and often prevented.  Routine colorectal cancer screening usually begins at 50 years of age and continues through 50 years of age.  Your health care provider may recommend screening at an earlier age if you have risk factors for colon cancer.  Your health care provider may also recommend using home test kits to check for hidden blood in the stool.  A small camera at the end of a tube can be used to examine your colon directly (sigmoidoscopy or colonoscopy). This is done to check for the earliest forms of colorectal cancer.  Routine screening usually begins at age 64.  Direct examination of the colon should be repeated every 5-10 years through 50 years of age. However, you may need  to be screened more often if early forms of precancerous polyps or small growths are found. Skin Cancer  Check your skin from head to toe regularly.  Tell your health care provider about any new moles or changes in moles, especially if there is a change in a mole's shape or color.  Also tell your health care provider if you have a mole that is larger than the size of a pencil eraser.  Always use sunscreen. Apply sunscreen liberally and repeatedly throughout the day.  Protect yourself by wearing long sleeves, pants, a wide-brimmed hat, and sunglasses whenever you are outside. HEART DISEASE, DIABETES, AND HIGH BLOOD PRESSURE   High blood pressure causes heart disease and increases the risk of stroke. High blood pressure is more likely to develop in:  People who have blood pressure in the high end of the normal range (130-139/85-89 mm Hg).  People who are overweight or  obese.  People who are African American.  If you are 68-38 years of age, have your blood pressure checked every 3-5 years. If you are 22 years of age or older, have your blood pressure checked every year. You should have your blood pressure measured twice--once when you are at a hospital or clinic, and once when you are not at a hospital or clinic. Record the average of the two measurements. To check your blood pressure when you are not at a hospital or clinic, you can use:  An automated blood pressure machine at a pharmacy.  A home blood pressure monitor.  If you are between 63 years and 72 years old, ask your health care provider if you should take aspirin to prevent strokes.  Have regular diabetes screenings. This involves taking a blood sample to check your fasting blood sugar level.  If you are at a normal weight and have a low risk for diabetes, have this test once every three years after 50 years of age.  If you are overweight and have a high risk for diabetes, consider being tested at a younger age or more often. PREVENTING INFECTION  Hepatitis B  If you have a higher risk for hepatitis B, you should be screened for this virus. You are considered at high risk for hepatitis B if:  You were born in a country where hepatitis B is common. Ask your health care provider which countries are considered high risk.  Your parents were born in a high-risk country, and you have not been immunized against hepatitis B (hepatitis B vaccine).  You have HIV or AIDS.  You use needles to inject street drugs.  You live with someone who has hepatitis B.  You have had sex with someone who has hepatitis B.  You get hemodialysis treatment.  You take certain medicines for conditions, including cancer, organ transplantation, and autoimmune conditions. Hepatitis C  Blood testing is recommended for:  Everyone born from [birth year] through 1965.  Anyone with known risk factors for hepatitis  C. Sexually transmitted infections (STIs)  You should be screened for sexually transmitted infections (STIs) including gonorrhea and chlamydia if:  You are sexually active and are younger than 50 years of age.  You are older than 50 years of age and your health care provider tells you that you are at risk for this type of infection.  Your sexual activity has changed since you were last screened and you are at an increased risk for chlamydia or gonorrhea. Ask your health care provider if you are at risk.  If  you do not have HIV, but are at risk, it may be recommended that you take a prescription medicine daily to prevent HIV infection. This is called pre-exposure prophylaxis (PrEP). You are considered at risk if:  You are sexually active and do not regularly use condoms or know the HIV status of your partner(s).  You take drugs by injection.  You are sexually active with a partner who has HIV. Talk with your health care provider about whether you are at high risk of being infected with HIV. If you choose to begin PrEP, you should first be tested for HIV. You should then be tested every 3 months for as long as you are taking PrEP.  PREGNANCY   If you are premenopausal and you may become pregnant, ask your health care provider about preconception counseling.  If you may become pregnant, take 400 to 800 micrograms (mcg) of folic acid every day.  If you want to prevent pregnancy, talk to your health care provider about birth control (contraception). OSTEOPOROSIS AND MENOPAUSE   Osteoporosis is a disease in which the bones lose minerals and strength with aging. This can result in serious bone fractures. Your risk for osteoporosis can be identified using a bone density scan.  If you are 42 years of age or older, or if you are at risk for osteoporosis and fractures, ask your health care provider if you should be screened.  Ask your health care provider whether you should take a calcium or  vitamin D supplement to lower your risk for osteoporosis.  Menopause may have certain physical symptoms and risks.  Hormone replacement therapy may reduce some of these symptoms and risks. Talk to your health care provider about whether hormone replacement therapy is right for you.  HOME CARE INSTRUCTIONS   Schedule regular health, dental, and eye exams.  Stay current with your immunizations.   Do not use any tobacco products including cigarettes, chewing tobacco, or electronic cigarettes.  If you are pregnant, do not drink alcohol.  If you are breastfeeding, limit how much and how often you drink alcohol.  Limit alcohol intake to no more than 1 drink per day for nonpregnant women. One drink equals 12 ounces of beer, 5 ounces of wine, or 1 ounces of hard liquor.  Do not use street drugs.  Do not share needles.  Ask your health care provider for help if you need support or information about quitting drugs.  Tell your health care provider if you often feel depressed.  Tell your health care provider if you have ever been abused or do not feel safe at home.   This information is not intended to replace advice given to you by your health care provider. Make sure you discuss any questions you have with your health care provider.   Document Released: 09/22/2010 Document Revised: 03/30/2014 Document Reviewed: 02/08/2013 Elsevier Interactive Patient Education [employer].

## 2015-07-20 ENCOUNTER — Encounter: Payer: Self-pay | Admitting: Family Medicine

## 2015-07-20 LAB — COMPREHENSIVE METABOLIC PANEL
ALK PHOS: 73 IU/L (ref 39–117)
ALT: 27 IU/L (ref 0–32)
AST: 27 IU/L (ref 0–40)
Albumin/Globulin Ratio: 1.5 (ref 1.2–2.2)
Albumin: 4.4 g/dL (ref 3.5–5.5)
BUN/Creatinine Ratio: 19 (ref 9–23)
BUN: 14 mg/dL (ref 6–24)
Bilirubin Total: 0.4 mg/dL (ref 0.0–1.2)
CHLORIDE: 98 mmol/L (ref 96–106)
CO2: 26 mmol/L (ref 18–29)
CREATININE: 0.74 mg/dL (ref 0.57–1.00)
Calcium: 9.8 mg/dL (ref 8.7–10.2)
GFR calc Af Amer: 110 mL/min/{1.73_m2} (ref >59)
GFR calc non Af Amer: 95 mL/min/{1.73_m2} (ref >59)
GLUCOSE: 85 mg/dL (ref 65–99)
Globulin, Total: 3 g/dL (ref 1.5–4.5)
Potassium: 4 mmol/L (ref 3.5–5.2)
Sodium: 141 mmol/L (ref 134–144)
Total Protein: 7.4 g/dL (ref 6.0–8.5)

## 2015-07-20 LAB — CBC WITH DIFFERENTIAL/PLATELET
BASOS ABS: 0 10*3/uL (ref 0.0–0.2)
Basos: 0 %
EOS (ABSOLUTE): 0.1 10*3/uL (ref 0.0–0.4)
Eos: 1 %
Hematocrit: 43.1 % (ref 34.0–46.6)
Hemoglobin: 14.5 g/dL (ref 11.1–15.9)
IMMATURE GRANS (ABS): 0 10*3/uL (ref 0.0–0.1)
IMMATURE GRANULOCYTES: 0 %
LYMPHS: 32 %
Lymphocytes Absolute: 2.6 10*3/uL (ref 0.7–3.1)
MCH: 29.2 pg (ref 26.6–33.0)
MCHC: 33.6 g/dL (ref 31.5–35.7)
MCV: 87 fL (ref 79–97)
Monocytes Absolute: 0.7 10*3/uL (ref 0.1–0.9)
Monocytes: 8 %
NEUTROS PCT: 59 %
Neutrophils Absolute: 4.9 10*3/uL (ref 1.4–7.0)
PLATELETS: 367 10*3/uL (ref 150–379)
RBC: 4.96 x10E6/uL (ref 3.77–5.28)
RDW: 13 % (ref 12.3–15.4)
WBC: 8.3 10*3/uL (ref 3.4–10.8)

## 2015-07-20 LAB — LIPID PANEL W/O CHOL/HDL RATIO
CHOLESTEROL TOTAL: 154 mg/dL (ref 100–199)
HDL: 56 mg/dL (ref >39)
LDL CALC: 86 mg/dL (ref 0–99)
Triglycerides: 58 mg/dL (ref 0–149)
VLDL Cholesterol Cal: 12 mg/dL (ref 5–40)

## 2015-07-20 LAB — VITAMIN D 25 HYDROXY (VIT D DEFICIENCY, FRACTURES): Vit D, 25-Hydroxy: 29.9 ng/mL — ABNORMAL LOW (ref 30.0–100.0)

## 2015-07-20 LAB — VITAMIN B12: Vitamin B-12: 721 pg/mL (ref 211–946)

## 2015-07-20 LAB — TSH: TSH: 0.637 u[IU]/mL (ref 0.450–4.500)

## 2015-07-20 LAB — HIV ANTIBODY (ROUTINE TESTING W REFLEX): HIV Screen 4th Generation wRfx: NONREACTIVE

## 2015-07-22 DIAGNOSIS — Z0000 Encounter for general adult medical examination without abnormal findings: Secondary | ICD-10-CM | POA: Insufficient documentation

## 2015-07-22 NOTE — Assessment & Plan Note (Signed)
See AVS; encouraged pt to work on weight loss

## 2015-07-22 NOTE — Assessment & Plan Note (Signed)
USPSTF grade A and B recommendations reviewed with patient; age-appropriate recommendations, preventive care, screening tests, etc discussed and encouraged; healthy living encouraged; see AVS for patient education given to patient  

## 2015-07-23 ENCOUNTER — Other Ambulatory Visit: Payer: Self-pay | Admitting: Family Medicine

## 2015-07-23 NOTE — Telephone Encounter (Signed)
CMP reviewed; Rx approved

## 2015-07-24 LAB — PAP LB AND HPV HIGH-RISK
HPV, HIGH-RISK: NEGATIVE
PAP Smear Comment: 0

## 2015-09-04 ENCOUNTER — Ambulatory Visit (INDEPENDENT_AMBULATORY_CARE_PROVIDER_SITE_OTHER): Payer: 59 | Admitting: Family Medicine

## 2015-09-04 ENCOUNTER — Encounter: Payer: Self-pay | Admitting: Family Medicine

## 2015-09-04 VITALS — BP 122/76 | HR 118 | Temp 98.4°F | Resp 20 | Ht 63.0 in | Wt 198.5 lb

## 2015-09-04 DIAGNOSIS — H8110 Benign paroxysmal vertigo, unspecified ear: Secondary | ICD-10-CM

## 2015-09-04 DIAGNOSIS — R110 Nausea: Secondary | ICD-10-CM

## 2015-09-04 DIAGNOSIS — R42 Dizziness and giddiness: Secondary | ICD-10-CM

## 2015-09-04 MED ORDER — AMOXICILLIN-POT CLAVULANATE 875-125 MG PO TABS: 1.0000 | ORAL_TABLET | Freq: Two times a day (BID) | ORAL | Status: DC

## 2015-09-04 MED ORDER — ONDANSETRON 4 MG PO TBDP: 4.0000 mg | ORAL_TABLET | Freq: Three times a day (TID) | ORAL | Status: DC | PRN

## 2015-09-04 MED ORDER — MECLIZINE HCL 25 MG PO TABS
25.0000 mg | ORAL_TABLET | Freq: Three times a day (TID) | ORAL | Status: DC | PRN
Start: 2015-09-04 — End: 2015-09-04

## 2015-09-04 MED ORDER — MECLIZINE HCL 25 MG PO TABS: 25.0000 mg | ORAL_TABLET | Freq: Three times a day (TID) | ORAL | Status: DC | PRN

## 2015-09-04 NOTE — Patient Instructions (Addendum)
Pay attention to symptoms and listen to your body Start the augmentin Use the meclizine as needed Use the zofran for nausea as needed You are welcome to try the maneuvers at home but call for therapy referral if desired If any worrisome symptoms, then go to urgent care or ER Do NOT drive if feeling dizzy

## 2015-09-04 NOTE — Progress Notes (Signed)
BP 122/76 mmHg  Pulse 118  Temp(Src) 98.4 F (36.9 C) (Oral)  Resp 20  Ht 5\' 3"  (1.6 m)  Wt 198 lb 8 oz (90.039 kg)  BMI 35.17 kg/m2  SpO2 95%   Subjective:    Patient ID: Wendy Li, female    DOB: [birth year], 50 y.o.   MRN: [id number]  HPI: Wendy Li is a 50 y.o. female  Chief Complaint  Patient presents with  . Dizziness   [place] the beach, first symptoms on Friday morning; went to put dog on the floor and started in the bed, sat p from laying to sitting and that's when it started; thought maybe a little dehydrated, put dog off the side of the bed and had to grab hold of the wall; had to hold the wall to keep from falling She was coated in sweat and that's when the nausea hit; dry heaved until bile came up; heaving and sweatng Used some dramamine and that took care of the symptoms that first day; within 30 minutes up and walking around, loading up the truck and fished and went to the beach all day Did fine the rest of the day, and all Satuday Symptoms came back on Sunday; no rolling nausea, no profuse sweating, just the spinning sensation; had to sit on the floor to do laundry; went to bed and thought she'd feel better in the morning on Monday Got up Monday morning; took the dramamine 50 mg Monday morning, went to work and did okay No symptoms Tuesday Felt fine and took it easy and able to do job duties, doing paperwork and did fine Got up this morning and it was back She took the last dramamine already, they had 25 mg and she took one of those and it helped some, bending over and chores really affected her She was a trooper, had her uniform on; 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 Hypertension   . Elevated blood pressure   . Obesity   . Cervical radiculopathy at C6   . Vitamin D deficiency disease   . Vitamin B12 deficiency   . Abnormal Pap smear of cervix 1994-2002    ASCUS, colposcopy, LEEP procedure-no abnormals since   Past Surgical History  Procedure Laterality Date  . Dilation and curettage of uterus  1989  . Tonsillectomy  1995  . Hand surgery  1997    cyst removal   Family History  Problem Relation Age of Onset  . Cancer Mother   . Hyperlipidemia Mother   . Hypertension Mother   . Atrial fibrillation Father   . Heart disease Father     angioplasty  . Hyperlipidemia Father   . Thyroid disease Father   . Arthritis Sister   . Hypertension Maternal Grandmother   . Blindness Maternal Grandmother   . Cancer Paternal Grandfather     testicular  Social History  Substance Use Topics  . Smoking status: Former Research scientist (life sciences)  . Smokeless tobacco: Never Used  . Alcohol Use: No   Interim medical history since last visit reviewed. Allergies and medications reviewed  Review of Systems Per HPI unless specifically indicated above     Objective:    BP 122/76 mmHg  Pulse 118  Temp(Src) 98.4 F (36.9 C) (Oral)  Resp 20  Ht 5\' 3"  (1.6 m)  Wt 198 lb 8 oz (90.039 kg)  BMI 35.17 kg/m2  SpO2 95%  Wt Readings from Last 3 Encounters:  09/04/15 198 lb 8 oz (90.039 kg)  07/19/15 199 lb (90.266 kg)  07/11/15 185 lb (83.915 kg)    Physical Exam  Constitutional: She appears well-developed and well-nourished. No distress.  HENT:  Right Ear: Hearing and external ear normal.  Left Ear: Hearing and external ear normal.  Nose: No rhinorrhea.  Mouth/Throat: Oropharynx is clear and moist and  mucous membranes are normal.  Eyes: Pupils are equal, round, and reactive to light. Right conjunctiva is not injected. Right conjunctiva has no hemorrhage. Left conjunctiva is not injected. Left conjunctiva has no hemorrhage. No scleral icterus. Right eye exhibits no nystagmus. Left eye exhibits no nystagmus.  Cardiovascular: Normal rate and regular rhythm.   Suspect intake pulse was not accurate as she was not tachycardic during exam  Neurological: She is alert. She displays no atrophy and no tremor. No cranial nerve deficit or sensory deficit. She exhibits normal muscle tone. Coordination and gait normal.  Skin: No rash noted. No pallor.  Psychiatric: Her mood appears anxious. She does not exhibit a depressed mood.    Results for orders placed or performed in visit on 07/19/15  HIV antibody  Result Value Ref Range   HIV Screen 4th Generation wRfx Non Reactive Non Reactive  Lipid Panel w/o Chol/HDL Ratio  Result Value Ref Range   Cholesterol, Total 154 100 - 199 mg/dL   Triglycerides 58 0 - 149 mg/dL   HDL 56 >39 mg/dL   VLDL Cholesterol Cal 12 5 - 40 mg/dL   LDL Calculated 86 0 - 99 mg/dL  TSH  Result Value Ref Range   TSH 0.637 0.450 - 4.500 uIU/mL  CBC with Differential/Platelet  Result Value Ref Range   WBC 8.3 3.4 - 10.8 x10E3/uL   RBC 4.96 3.77 - 5.28 x10E6/uL   Hemoglobin 14.5 11.1 - 15.9 g/dL   Hematocrit 43.1 34.0 - 46.6 %   MCV 87 79 - 97 fL   MCH 29.2 26.6 - 33.0 pg   MCHC 33.6 31.5 - 35.7 g/dL   RDW 13.0 12.3 - 15.4 %   Platelets 367 150 - 379 x10E3/uL   Neutrophils 59 %   Lymphs 32 %   Monocytes 8 %   Eos 1 %   Basos 0 %   Neutrophils Absolute 4.9 1.4 - 7.0 x10E3/uL   Lymphocytes Absolute 2.6 0.7 - 3.1 x10E3/uL   Monocytes Absolute 0.7 0.1 - 0.9 x10E3/uL   EOS (ABSOLUTE) 0.1 0.0 - 0.4 x10E3/uL   Basophils Absolute 0.0 0.0 - 0.2 x10E3/uL   Immature Granulocytes 0 %   Immature Grans (Abs) 0.0 0.0 - 0.1 x10E3/uL  Vitamin B12  Result Value Ref Range   Vitamin  B-12 721 211 - 946 pg/mL  VITAMIN D 25 Hydroxy (Vit-D Deficiency, Fractures)  Result Value Ref Range   Vit D, 25-Hydroxy 29.9 (L) 30.0 - 100.0 ng/mL  Comprehensive metabolic panel  Result Value Ref Range  Glucose 85 65 - 99 mg/dL   BUN 14 6 - 24 mg/dL   Creatinine, Ser 0.74 0.57 - 1.00 mg/dL   GFR calc non Af Amer 95 >59 mL/min/1.73   GFR calc Af Amer 110 >59 mL/min/1.73   BUN/Creatinine Ratio 19 9 - 23   Sodium 141 134 - 144 mmol/L   Potassium 4.0 3.5 - 5.2 mmol/L   Chloride 98 96 - 106 mmol/L   CO2 26 18 - 29 mmol/L   Calcium 9.8 8.7 - 10.2 mg/dL   Total Protein 7.4 6.0 - 8.5 g/dL   Albumin 4.4 3.5 - 5.5 g/dL   Globulin, Total 3.0 1.5 - 4.5 g/dL   Albumin/Globulin Ratio 1.5 1.2 - 2.2   Bilirubin Total 0.4 0.0 - 1.2 mg/dL   Alkaline Phosphatase 73 39 - 117 IU/L   AST 27 0 - 40 IU/L   ALT 27 0 - 32 IU/L  Pap liquid-based and HPV (high risk)  Result Value Ref Range   DIAGNOSIS: Comment    Specimen adequacy: Comment    CLINICIAN PROVIDED ICD10: Comment    Performed by: Comment    PAP SMEAR COMMENT .    Note: Comment    HPV, high-risk Negative Negative      Assessment & Plan:   Problem List Items Addressed This Visit    None    Visit Diagnoses    Nausea    -  Primary    discussed dx, start meds; call if needed    Dizziness        discussed dx, start meds; call if needed    Vertigo        discussed dx, start meds; call if needed    BPPV (benign paroxysmal positional vertigo), unspecified laterality        discussed dx, start meds; call if needed        Follow up plan: No Follow-up on file.  An after-visit summary was printed and given to the patient at [facility].  Please see the patient instructions which may contain other information and recommendations beyond what is mentioned above in the assessment and plan.  Meds ordered this encounter  Medications  . DISCONTD: meclizine (DRAMAMINE II) 25 MG tablet    Sig: Take 25 mg by mouth as needed for dizziness.  Marland Kitchen  DISCONTD: amoxicillin-clavulanate (AUGMENTIN) 875-125 MG tablet    Sig: Take 1 tablet by mouth 2 (two) times daily.    Dispense:  20 tablet    Refill:  0  . DISCONTD: meclizine (MEDI-MECLIZINE) 25 MG tablet    Sig: Take 1 tablet (25 mg total) by mouth 3 (three) times daily as needed for dizziness.    Dispense:  30 tablet    Refill:  0  . DISCONTD: ondansetron (ZOFRAN ODT) 4 MG disintegrating tablet    Sig: Take 1 tablet (4 mg total) by mouth every 8 (eight) hours as needed for nausea or vomiting.    Dispense:  20 tablet    Refill:  0  . ondansetron (ZOFRAN ODT) 4 MG disintegrating tablet    Sig: Take 1 tablet (4 mg total) by mouth every 8 (eight) hours as needed for nausea or vomiting.    Dispense:  20 tablet    Refill:  0  . meclizine (MEDI-MECLIZINE) 25 MG tablet    Sig: Take 1 tablet (25 mg total) by mouth 3 (three) times daily as needed for dizziness.    Dispense:  30 tablet    Refill:  0  . amoxicillin-clavulanate (AUGMENTIN) 875-125 MG tablet    Sig: Take 1 tablet by mouth 2 (two) times daily.    Dispense:  20 tablet    Refill:  0   Face-to-face time with patient was more than 25 minutes, >50% time spent counseling and coordination of care

## 2015-11-27 ENCOUNTER — Other Ambulatory Visit: Payer: Self-pay | Admitting: Family Medicine

## 2015-11-27 DIAGNOSIS — Z1231 Encounter for screening mammogram for malignant neoplasm of breast: Secondary | ICD-10-CM

## 2016-01-06 ENCOUNTER — Ambulatory Visit
Admission: RE | Admit: 2016-01-06 | Discharge: 2016-01-06 | Disposition: A | Discharge status: Home / Self Care | Payer: 59 | Source: Ambulatory Visit | Attending: Family Medicine | Admitting: Family Medicine

## 2016-01-06 ENCOUNTER — Other Ambulatory Visit: Payer: Self-pay | Admitting: Family Medicine

## 2016-01-06 DIAGNOSIS — Z1231 Encounter for screening mammogram for malignant neoplasm of breast: Secondary | ICD-10-CM | POA: Diagnosis not present

## 2016-01-13 ENCOUNTER — Telehealth: Payer: Self-pay | Admitting: Family Medicine

## 2016-01-13 ENCOUNTER — Ambulatory Visit (INDEPENDENT_AMBULATORY_CARE_PROVIDER_SITE_OTHER): Payer: 59 | Admitting: Family Medicine

## 2016-01-13 ENCOUNTER — Encounter: Payer: Self-pay | Admitting: Family Medicine

## 2016-01-13 DIAGNOSIS — H60501 Unspecified acute noninfective otitis externa, right ear: Secondary | ICD-10-CM | POA: Insufficient documentation

## 2016-01-13 DIAGNOSIS — H60331 Swimmer's ear, right ear: Secondary | ICD-10-CM

## 2016-01-13 MED ORDER — CIPROFLOXACIN-DEXAMETHASONE 0.3-0.1 % OT SUSP: 4.0000 [drp] | Freq: Two times a day (BID) | OTIC | 0 refills | Status: AC

## 2016-01-13 NOTE — Telephone Encounter (Signed)
Please inform patient that she was prescribed ciprofloxacin-dexamethasone eardrops. Dexamethasone is a steroid similar to prednisone

## 2016-01-13 NOTE — Progress Notes (Signed)
Name: Wendy Li   MRN: [id number]    DOB: September 16, 1965   Date:01/13/2016       Progress Note  Subjective  Chief Complaint  Chief Complaint  Patient presents with  . Ear Pain  This patient is usually followed by Dr. Sanda Klein, new to me  Otalgia   There is pain in the right ear. This is a new problem. The current episode started in the past 7 days (started not feeling good since Thursday last week, then last night woke up with ear 'gurgling' and blood soaked Q-tip ). There has been no fever. Associated symptoms include ear discharge and headaches. Pertinent negatives include no abdominal pain, coughing, rhinorrhea, sore throat or vomiting. She has tried NSAIDs for the symptoms. The treatment provided no relief.    Past Medical History:  Diagnosis Date  . Abnormal Pap smear of cervix 1994-2002   ASCUS, colposcopy, LEEP procedure-no abnormals since  . Cervical radiculopathy at C6   . Elevated blood pressure   . Hypertension   . Obesity   . Vitamin B12 deficiency   . Vitamin D deficiency disease     Past Surgical History:  Procedure Laterality Date  . DILATION AND CURETTAGE OF UTERUS  1989  . HAND SURGERY  1997   cyst removal  . TONSILLECTOMY  1995    Family History  Problem Relation Age of Onset  . Cancer Mother   . Hyperlipidemia Mother   . Hypertension Mother   . Atrial fibrillation Father   . Heart disease Father     angioplasty  . Hyperlipidemia Father   . Thyroid disease Father   . Arthritis Sister   . Hypertension Maternal Grandmother   . Blindness Maternal Grandmother   . Cancer Paternal Grandfather     testicular  . Breast cancer Neg Hx     Social History   Social History  . Marital status: Married    Spouse name: N/A  . Number of children: N/A  . Years of education: N/A   Occupational History  . Not on file.   Social History Main Topics  . Smoking status: Former Research scientist (life sciences)  . Smokeless tobacco: Never Used  . Alcohol use No  . Drug use: No   . Sexual activity: Not on file   Other Topics Concern  . Not on file   Social History Narrative  . No narrative on file     Current Outpatient Prescriptions:  .  Cholecalciferol (VITAMIN D) 2000 UNITS CAPS, Take 2,000 Units by mouth once a week., Disp: , Rfl:  .  hydrochlorothiazide (HYDRODIURIL) 25 MG tablet, Take 1 tablet by mouth  daily, Disp: 90 tablet, Rfl: 3 .  meclizine (MEDI-MECLIZINE) 25 MG tablet, Take 1 tablet (25 mg total) by mouth 3 (three) times daily as needed for dizziness. (Patient not taking: Reported on 01/13/2016), Disp: 30 tablet, Rfl: 0 .  ondansetron (ZOFRAN ODT) 4 MG disintegrating tablet, Take 1 tablet (4 mg total) by mouth every 8 (eight) hours as needed for nausea or vomiting. (Patient not taking: Reported on 01/13/2016), Disp: 20 tablet, Rfl: 0  Allergies  Allergen Reactions  . Erythromycin Hives and Other (See Comments)    GI upset     Review of Systems  Constitutional: Positive for malaise/fatigue. Negative for chills and fever.  HENT: Positive for ear discharge and ear pain. Negative for congestion, nosebleeds, rhinorrhea and sore throat.   Respiratory: Negative for cough.   Cardiovascular: Negative for chest pain.  Gastrointestinal: Negative for  abdominal pain, nausea and vomiting.  Neurological: Positive for headaches.    Objective  Vitals:   01/13/16 1055  BP: 120/68  Pulse: 87  Resp: 16  Temp: 98.4 F (36.9 C)  TempSrc: Oral  SpO2: 98%  Weight: 201 lb 6.4 oz (91.4 kg)  Height: 5\' 3"  (1.6 m)    Physical Exam  Constitutional: She is oriented to person, place, and time and well-developed, well-nourished, and in no distress.  HENT:  Head: Normocephalic and atraumatic.  Right Ear: There is drainage, swelling and tenderness.  Mouth/Throat: No oropharyngeal exudate or posterior oropharyngeal erythema.  Right ear canal erythematous, blood-tinged drainage visible in the canal, distally covered with wax, TM not visualized.     Cardiovascular: Normal rate, regular rhythm, S1 normal and S2 normal.   No murmur heard. Pulmonary/Chest: Effort normal and breath sounds normal. She has no decreased breath sounds. She has no wheezes. She has no rhonchi.  Neurological: She is alert and oriented to person, place, and time.  Psychiatric: Mood, memory, affect and judgment normal.  Nursing note and vitals reviewed.   Assessment & Plan  1. Acute swimmer's ear of right side Started on ciprofloxacin-dexamethasone for relief - ciprofloxacin-dexamethasone (CIPRODEX) otic suspension; Place 4 drops into the right ear 2 (two) times daily.  Dispense: 7.5 mL; Refill: 0   Dalinda Heidt Asad A. [facility] 01/13/2016 11:18 AM

## 2016-01-13 NOTE — Telephone Encounter (Signed)
Patient was seen today. She stated that you where going to prescribe her predinsone due to her ear infection, however [facility] did not receive the prescription. Please resend.

## 2016-01-14 NOTE — Telephone Encounter (Signed)
Patient verbally informed and thanks you.

## 2016-01-20 ENCOUNTER — Telehealth: Payer: Self-pay | Admitting: Family Medicine

## 2016-01-20 NOTE — Telephone Encounter (Signed)
Pt came in last Monday and saw Dr Manuella Ghazi for ear issues. Pt has taken all 7 days of antibiotic and now she states she cannot hear out of her right ear. Pt states she is supposed to see Dr Sanda Klein in one week for a fu and wants to know should she come in? Pleasea advise.

## 2016-01-20 NOTE — Telephone Encounter (Signed)
I can't really help over the phone, sorry; I'll suggest routing to Dr. Manuella Ghazi who saw her or inviting her to go to urgent care; thank you

## 2016-01-21 NOTE — Telephone Encounter (Addendum)
Called patient and left a voice message. I can see her for reevaluation of symptoms

## 2016-01-23 ENCOUNTER — Ambulatory Visit (INDEPENDENT_AMBULATORY_CARE_PROVIDER_SITE_OTHER): Payer: 59 | Admitting: Family Medicine

## 2016-01-23 ENCOUNTER — Ambulatory Visit: Payer: 59 | Admitting: Family Medicine

## 2016-01-23 ENCOUNTER — Encounter: Payer: Self-pay | Admitting: Family Medicine

## 2016-01-23 VITALS — BP 120/80 | HR 110 | Temp 98.6°F | Resp 16 | Ht 63.0 in | Wt 200.9 lb

## 2016-01-23 DIAGNOSIS — J343 Hypertrophy of nasal turbinates: Secondary | ICD-10-CM | POA: Insufficient documentation

## 2016-01-23 DIAGNOSIS — R220 Localized swelling, mass and lump, head: Secondary | ICD-10-CM | POA: Diagnosis not present

## 2016-01-23 HISTORY — DX: Hypertrophy of nasal turbinates: J34.3

## 2016-01-23 MED ORDER — MOXIFLOXACIN HCL 400 MG PO TABS: 400.0000 mg | ORAL_TABLET | Freq: Every day | ORAL | 0 refills | Status: DC

## 2016-01-23 MED ORDER — FLUTICASONE PROPIONATE 50 MCG/ACT NA SUSP: 2.0000 | Freq: Every day | NASAL | 1 refills | Status: DC

## 2016-01-23 NOTE — Progress Notes (Signed)
Name: Wendy Li   MRN: [id number]    DOB: Apr 25, 1965   Date:01/23/2016       Progress Note  Subjective  Chief Complaint  Chief Complaint  Patient presents with  . Ear Fullness    follow up ear pain and swelling    HPI  Right Ear Pain and Right Neck Swelling: Pt. Presents for recheck of right ear, she was seen last week for symptoms of right ear discharge and headaches. She was diagnosed with swimmer's ear and started on Ciprodex suspension drops. She has finished the course of ear drops and feels that the pain is better, discharge is gone,  now noticing swelling on the right side of neck.     Past Medical History:  Diagnosis Date  . Abnormal Pap smear of cervix 1994-2002   ASCUS, colposcopy, LEEP procedure-no abnormals since  . Cervical radiculopathy at C6   . Elevated blood pressure   . Hypertension   . Obesity   . Vitamin B12 deficiency   . Vitamin D deficiency disease     Past Surgical History:  Procedure Laterality Date  . DILATION AND CURETTAGE OF UTERUS  1989  . HAND SURGERY  1997   cyst removal  . TONSILLECTOMY  1995    Family History  Problem Relation Age of Onset  . Cancer Mother   . Hyperlipidemia Mother   . Hypertension Mother   . Atrial fibrillation Father   . Heart disease Father     angioplasty  . Hyperlipidemia Father   . Thyroid disease Father   . Arthritis Sister   . Hypertension Maternal Grandmother   . Blindness Maternal Grandmother   . Cancer Paternal Grandfather     testicular  . Breast cancer Neg Hx     Social History   Social History  . Marital status: Married    Spouse name: N/A  . Number of children: N/A  . Years of education: N/A   Occupational History  . Not on file.   Social History Main Topics  . Smoking status: Former Research scientist (life sciences)  . Smokeless tobacco: Never Used  . Alcohol use No  . Drug use: No  . Sexual activity: Not on file   Other Topics Concern  . Not on file   Social History Narrative  . No  narrative on file     Current Outpatient Prescriptions:  .  Cholecalciferol (VITAMIN D) 2000 UNITS CAPS, Take 2,000 Units by mouth once a week., Disp: , Rfl:  .  hydrochlorothiazide (HYDRODIURIL) 25 MG tablet, Take 1 tablet by mouth  daily, Disp: 90 tablet, Rfl: 3 .  meclizine (MEDI-MECLIZINE) 25 MG tablet, Take 1 tablet (25 mg total) by mouth 3 (three) times daily as needed for dizziness. (Patient not taking: Reported on 01/23/2016), Disp: 30 tablet, Rfl: 0 .  ondansetron (ZOFRAN ODT) 4 MG disintegrating tablet, Take 1 tablet (4 mg total) by mouth every 8 (eight) hours as needed for nausea or vomiting. (Patient not taking: Reported on 01/23/2016), Disp: 20 tablet, Rfl: 0  Allergies  Allergen Reactions  . Erythromycin Hives and Other (See Comments)    GI upset     Review of Systems  Constitutional: Negative for chills and fever.  HENT: Positive for ear pain. Negative for ear discharge.   Respiratory: Negative for cough.      Objective  Vitals:   01/23/16 1137  BP: 120/80  Pulse: (!) 110  Resp: 16  Temp: 98.6 F (37 C)  TempSrc: Oral  SpO2:  97%  Weight: 200 lb 14.4 oz (91.1 kg)  Height: 5\' 3"  (1.6 m)    Physical Exam  Constitutional: She is oriented to person, place, and time and well-developed, well-nourished, and in no distress.  HENT:  Right Ear: Ear canal normal. No drainage, swelling or tenderness.  Left Ear: Tympanic membrane and ear canal normal. No drainage, swelling or tenderness.  Mouth/Throat: No posterior oropharyngeal erythema.  Right TM not visible due to cerumen impaction, canal is normal, no drainage. Nasal mucosal inflamamtion, turbinates hypertrophied.  Neck:  Mild erythema over the right anterior neck, no palpable lymphadenopathy   Cardiovascular: Normal rate, regular rhythm, S1 normal, S2 normal and normal heart sounds.   No murmur heard. Pulmonary/Chest: Effort normal and breath sounds normal. She has no wheezes. She has no rhonchi.  Neurological:  She is alert and oriented to person, place, and time.  Psychiatric: Mood, memory, affect and judgment normal.  Nursing note and vitals reviewed.    Assessment & Plan  1. Nasal turbinate hypertrophy Likely from sinusitis, started on Flonase - fluticasone (FLONASE) 50 MCG/ACT nasal spray; Place 2 sprays into both nostrils daily.  Dispense: 16 g; Refill: 1  2. Right facial swelling Mild erythema and tenderness over the right mandibular and neck area,, we'll start on moxifloxacin for upper respiratory tract infection - moxifloxacin (AVELOX) 400 MG tablet; Take 1 tablet (400 mg total) by mouth daily at 8 pm.  Dispense: 5 tablet; Refill: 0   Wendy Li Asad A. [facility] 01/23/2016 11:55 AM

## 2016-02-06 ENCOUNTER — Encounter: Payer: Self-pay | Admitting: Family Medicine

## 2016-02-06 ENCOUNTER — Ambulatory Visit (INDEPENDENT_AMBULATORY_CARE_PROVIDER_SITE_OTHER): Payer: 59 | Admitting: Family Medicine

## 2016-02-06 VITALS — BP 125/77 | HR 88 | Temp 99.4°F | Resp 16 | Ht 63.0 in | Wt 204.6 lb

## 2016-02-06 DIAGNOSIS — H6121 Impacted cerumen, right ear: Secondary | ICD-10-CM | POA: Diagnosis not present

## 2016-02-06 NOTE — Progress Notes (Signed)
Name: Wendy Li   MRN: [id number]    DOB: Jul 29, 1965   Date:02/06/2016       Progress Note  Subjective  Chief Complaint  Chief Complaint  Patient presents with  . Follow-up    2 wk / ears    HPI  Pt. presents for follow up of right ear infection, swelling on the neck and face. She was prescribed Moxifloxacin and Flonase 2 weeks ago. She seems to be doing a lot better, swelling has resolved, still takes Flonase as needed. She wanted to have her right ear checked for cerumen impaction. She is also requesting a flu shot.   Past Medical History:  Diagnosis Date  . Abnormal Pap smear of cervix 1994-2002   ASCUS, colposcopy, LEEP procedure-no abnormals since  . Cervical radiculopathy at C6   . Elevated blood pressure   . Hypertension   . Obesity   . Vitamin B12 deficiency   . Vitamin D deficiency disease     Past Surgical History:  Procedure Laterality Date  . DILATION AND CURETTAGE OF UTERUS  1989  . HAND SURGERY  1997   cyst removal  . TONSILLECTOMY  1995    Family History  Problem Relation Age of Onset  . Cancer Mother   . Hyperlipidemia Mother   . Hypertension Mother   . Atrial fibrillation Father   . Heart disease Father     angioplasty  . Hyperlipidemia Father   . Thyroid disease Father   . Arthritis Sister   . Hypertension Maternal Grandmother   . Blindness Maternal Grandmother   . Cancer Paternal Grandfather     testicular  . Breast cancer Neg Hx     Social History   Social History  . Marital status: Married    Spouse name: N/A  . Number of children: N/A  . Years of education: N/A   Occupational History  . Not on file.   Social History Main Topics  . Smoking status: Former Research scientist (life sciences)  . Smokeless tobacco: Never Used  . Alcohol use No  . Drug use: No  . Sexual activity: Not on file   Other Topics Concern  . Not on file   Social History Narrative  . No narrative on file     Current Outpatient Prescriptions:  .  Cholecalciferol  (VITAMIN D) 2000 UNITS CAPS, Take 2,000 Units by mouth once a week., Disp: , Rfl:  .  fluticasone (FLONASE) 50 MCG/ACT nasal spray, Place 2 sprays into both nostrils daily., Disp: 16 g, Rfl: 1 .  hydrochlorothiazide (HYDRODIURIL) 25 MG tablet, Take 1 tablet by mouth  daily, Disp: 90 tablet, Rfl: 3 .  meclizine (MEDI-MECLIZINE) 25 MG tablet, Take 1 tablet (25 mg total) by mouth 3 (three) times daily as needed for dizziness., Disp: 30 tablet, Rfl: 0 .  ondansetron (ZOFRAN ODT) 4 MG disintegrating tablet, Take 1 tablet (4 mg total) by mouth every 8 (eight) hours as needed for nausea or vomiting., Disp: 20 tablet, Rfl: 0 .  moxifloxacin (AVELOX) 400 MG tablet, Take 1 tablet (400 mg total) by mouth daily at 8 pm. (Patient not taking: Reported on 02/06/2016), Disp: 5 tablet, Rfl: 0  Allergies  Allergen Reactions  . Erythromycin Hives and Other (See Comments)    GI upset     ROS Please see history of present illness for review of systems  Objective  Vitals:   02/06/16 1419  BP: 125/77  Pulse: 88  Resp: 16  Temp: 99.4 F (37.4 C)  TempSrc: Oral  SpO2: 96%  Weight: 204 lb 9.6 oz (92.8 kg)  Height: 5\' 3"  (1.6 m)    Physical Exam  Constitutional: She is oriented to person, place, and time and well-developed, well-nourished, and in no distress.  HENT:  Head: Normocephalic and atraumatic.  Right ear cerumen impaction.  Cardiovascular: Normal rate, regular rhythm and normal heart sounds.   No murmur heard. Pulmonary/Chest: Effort normal and breath sounds normal. She has no wheezes.  Neurological: She is alert and oriented to person, place, and time.  Psychiatric: Mood, memory, affect and judgment normal.  Nursing note and vitals reviewed.      Assessment & Plan  1. Impacted cerumen of right ear We'll refer to ENT for management of impacted cerumen. - Ambulatory referral to ENT   Zeola Brys Asad A. [facility] 02/06/2016 2:32 PM

## 2016-02-07 ENCOUNTER — Ambulatory Visit: Payer: 59 | Admitting: Family Medicine

## 2016-06-25 ENCOUNTER — Other Ambulatory Visit: Payer: Self-pay | Admitting: Family Medicine

## 2016-06-26 NOTE — Telephone Encounter (Signed)
Last labs reviewed appt in May Rx approved

## 2016-07-24 ENCOUNTER — Telehealth: Payer: Self-pay

## 2016-07-24 ENCOUNTER — Other Ambulatory Visit: Payer: Self-pay

## 2016-07-24 ENCOUNTER — Encounter: Payer: Self-pay | Admitting: Family Medicine

## 2016-07-24 ENCOUNTER — Ambulatory Visit (INDEPENDENT_AMBULATORY_CARE_PROVIDER_SITE_OTHER): Payer: 59 | Admitting: Family Medicine

## 2016-07-24 VITALS — BP 124/76 | HR 94 | Temp 98.4°F | Resp 14 | Ht 63.5 in | Wt 211.0 lb

## 2016-07-24 DIAGNOSIS — E559 Vitamin D deficiency, unspecified: Secondary | ICD-10-CM

## 2016-07-24 DIAGNOSIS — Z0000 Encounter for general adult medical examination without abnormal findings: Secondary | ICD-10-CM

## 2016-07-24 DIAGNOSIS — E538 Deficiency of other specified B group vitamins: Secondary | ICD-10-CM

## 2016-07-24 DIAGNOSIS — N898 Other specified noninflammatory disorders of vagina: Secondary | ICD-10-CM

## 2016-07-24 DIAGNOSIS — Z6836 Body mass index (BMI) 36.0-36.9, adult: Secondary | ICD-10-CM | POA: Diagnosis not present

## 2016-07-24 DIAGNOSIS — Z1211 Encounter for screening for malignant neoplasm of colon: Secondary | ICD-10-CM

## 2016-07-24 DIAGNOSIS — R232 Flushing: Secondary | ICD-10-CM

## 2016-07-24 DIAGNOSIS — J343 Hypertrophy of nasal turbinates: Secondary | ICD-10-CM

## 2016-07-24 DIAGNOSIS — E6609 Other obesity due to excess calories: Secondary | ICD-10-CM | POA: Diagnosis not present

## 2016-07-24 DIAGNOSIS — R87610 Atypical squamous cells of undetermined significance on cytologic smear of cervix (ASC-US): Secondary | ICD-10-CM | POA: Diagnosis not present

## 2016-07-24 HISTORY — DX: Encounter for screening for malignant neoplasm of colon: Z12.11

## 2016-07-24 MED ORDER — FLUTICASONE PROPIONATE 50 MCG/ACT NA SUSP: 2.0000 | Freq: Every day | NASAL | 1 refills | Status: DC

## 2016-07-24 NOTE — Assessment & Plan Note (Signed)
Refer to GI for colonoscopy.

## 2016-07-24 NOTE — Assessment & Plan Note (Signed)
Check today 

## 2016-07-24 NOTE — Telephone Encounter (Signed)
Gastroenterology Pre-Procedure Review  Request Date: 08/28/16 Requesting Physician: Dr. Allen Norris  PATIENT REVIEW QUESTIONS: The patient responded to the following health history questions as indicated:    1. Are you having any GI issues? no 2. Do you have a personal history of Polyps? no 3. Do you have a family history of Colon Cancer or Polyps? no 4. Diabetes Mellitus? no 5. Joint replacements in the past 12 months?no 6. Major health problems in the past 3 months?no 7. Any artificial heart valves, MVP, or defibrillator?no    MEDICATIONS & ALLERGIES:    Patient reports the following regarding taking any anticoagulation/antiplatelet therapy:   Plavix, Coumadin, Eliquis, Xarelto, Lovenox, Pradaxa, Brilinta, or Effient? no Aspirin? no  Patient confirms/reports the following medications:  Current Outpatient Prescriptions  Medication Sig Dispense Refill  . Cholecalciferol (VITAMIN D) 2000 UNITS CAPS Take 2,000 Units by mouth once a week.    . Flaxseed, Linseed, (FLAX SEED OIL) 1000 MG CAPS Take by mouth.    . fluticasone (FLONASE) 50 MCG/ACT nasal spray Place 2 sprays into both nostrils daily. 16 g 1  . hydrochlorothiazide (HYDRODIURIL) 25 MG tablet TAKE 1 TABLET BY MOUTH  DAILY 90 tablet 0  . ibuprofen (ADVIL,MOTRIN) 200 MG tablet Take 200 mg by mouth 2 (two) times daily.    . Turmeric 450 MG CAPS Take by mouth.     No current facility-administered medications for this visit.     Patient confirms/reports the following allergies:  Allergies  Allergen Reactions  . Erythromycin Hives and Other (See Comments)    GI upset    No orders of the defined types were placed in this encounter.   AUTHORIZATION INFORMATION Primary Insurance: 1D#: Group #:  Secondary Insurance: 1D#: Group #:  SCHEDULE INFORMATION: Date: 08/28/16 Time: Location:[facility]

## 2016-07-24 NOTE — Telephone Encounter (Signed)
Pt asked can you send a new RX. Pt has not been taking it since she's been out and she will need it.

## 2016-07-24 NOTE — Assessment & Plan Note (Signed)
USPSTF grade A and B recommendations reviewed with patient; age-appropriate recommendations, preventive care, screening tests, etc discussed and encouraged; healthy living encouraged; see AVS for patient education given to patient  

## 2016-07-24 NOTE — Progress Notes (Signed)
Patient ID: Wendy Li, female   DOB: [birth year], 51 y.o.   MRN: [id number]   Subjective:   Wendy Li is a 51 y.o. female here for a complete physical exam  Interim issues since last visit: she had inner and outer ear infections; came in to see Dr. Manuella Ghazi in November; infection got all cleared up; saw ENT and had large clump of wax removed as well  USPSTF grade A and B recommendations Depression:  Depression screen [facility] 2/9 07/24/2016 02/06/2016 07/19/2015  Decreased Interest 0 0 0  Down, Depressed, Hopeless 0 0 0  PHQ - 2 Score 0 0 0   Hypertension: BP Readings from Last 3 Encounters:  07/24/16 124/76  02/06/16 125/77  01/23/16 120/80   Obesity: she would be interested in physician-assisted weight loss; miserable like this; unable to go to the gym from vertigo; took 6 weeks to get better; then hurt knee, limped around for weeks; father had spinal stenosis;  Left arm has been okay Arthritis in neck and back; stretching and using turmeric, flax seed, helping  Wt Readings from Last 3 Encounters:  07/24/16 211 lb (95.7 kg)  02/06/16 204 lb 9.6 oz (92.8 kg)  01/23/16 200 lb 14.4 oz (91.1 kg)   BMI Readings from Last 3 Encounters:  07/24/16 36.79 kg/m  02/06/16 36.24 kg/m  01/23/16 35.59 kg/m    Alcohol: no, rare Tobacco use: former, quit 20 years  HIV, hep B, hep C: had HIV STD testing and prevention (chl/gon/syphilis): not interested Intimate partner violence: no abuse Breast cancer: weird thing in left armpit, not on breast, felt like muscle pull; lasted a few days and went away BRCA gene screening: grandfather (paternal) died from prostate cancer, remote 2nd aunt had breast cancer Cervical cancer screening: pap smear last year reviewed; will repeat today Osteoporosis: n/a Fall prevention/vitamin D: sitting outside 30 minute a day Lipids: check fasting today Lab Results  Component Value Date   CHOL 154 07/19/2015   Lab Results  Component Value  Date   HDL 56 07/19/2015   Lab Results  Component Value Date   LDLCALC 86 07/19/2015   Lab Results  Component Value Date   TRIG 58 07/19/2015   No results found for: CHOLHDL No results found for: LDLDIRECT Glucose:  Glucose  Date Value Ref Range Status  07/19/2015 85 65 - 99 mg/dL Final   Colorectal cancer:  Lung cancer:  n/a AAA: n/a Aspirin: n/a Diet: working on it Exercise: after the knee thing and back thing, working 6 days a week since August; no time Skin cancer: no worrisome moles; mother had BCC; encourage sunscreen during peak hours  Past Medical History:  Diagnosis Date  . Abnormal Pap smear of cervix 1994-2002   ASCUS, colposcopy, LEEP procedure-no abnormals since  . Cervical radiculopathy at C6   . Elevated blood pressure   . Hypertension   . Obesity   . Vitamin B12 deficiency   . Vitamin D deficiency disease    Past Surgical History:  Procedure Laterality Date  . DILATION AND CURETTAGE OF UTERUS  1989  . HAND SURGERY  1997   cyst removal  . TONSILLECTOMY  1995   Family History  Problem Relation Age of Onset  . Cancer Mother   . Hyperlipidemia Mother   . Hypertension Mother   . Atrial fibrillation Father   . Heart disease Father     angioplasty  . Hyperlipidemia Father   . Thyroid disease Father   . Arthritis Sister   .  Hypertension Maternal Grandmother   . Blindness Maternal Grandmother   . Cancer Paternal Grandfather     testicular  . Breast cancer Neg Hx    Social History  Substance Use Topics  . Smoking status: Former Research scientist (life sciences)  . Smokeless tobacco: Never Used  . Alcohol use No   Review of Systems  Objective:   Vitals:   07/24/16 0827  BP: 124/76  Pulse: 94  Resp: 14  Temp: 98.4 F (36.9 C)  TempSrc: Oral  SpO2: 98%  Weight: 211 lb (95.7 kg)  Height: 5' 3.5" (1.613 m)   Body mass index is 36.79 kg/m. Wt Readings from Last 3 Encounters:  07/24/16 211 lb (95.7 kg)  02/06/16 204 lb 9.6 oz (92.8 kg)  01/23/16 200 lb 14.4  oz (91.1 kg)   Physical Exam  Constitutional: She appears well-developed and well-nourished.  obese  HENT:  Head: Normocephalic and atraumatic.  Right Ear: Hearing, tympanic membrane, external ear and ear canal normal.  Left Ear: Hearing, tympanic membrane, external ear and ear canal normal.  Nose: No rhinorrhea.  Mouth/Throat: Oropharynx is clear and moist and mucous membranes are normal.  Eyes: Conjunctivae and EOM are normal. Right eye exhibits no hordeolum. Left eye exhibits no hordeolum. No scleral icterus.  Neck: Carotid bruit is not present. No thyromegaly present.  Cardiovascular: Normal rate, regular rhythm, S1 normal, S2 normal and normal heart sounds.   No extrasystoles are present.  Pulmonary/Chest: Effort normal and breath sounds normal. No respiratory distress. Right breast exhibits no inverted nipple, no mass, no nipple discharge, no skin change and no tenderness. Left breast exhibits no inverted nipple, no mass, no nipple discharge, no skin change and no tenderness. Breasts are symmetrical.  Abdominal: Soft. Normal appearance and bowel sounds are normal. She exhibits no distension, no abdominal bruit, no pulsatile midline mass and no mass. There is no hepatosplenomegaly. There is no tenderness. No hernia.  Genitourinary: Uterus normal. Pelvic exam was performed with patient prone. There is no rash or lesion on the right labia. There is no rash or lesion on the left labia. Cervix exhibits no motion tenderness. Right adnexum displays no mass, no tenderness and no fullness. Left adnexum displays no mass, no tenderness and no fullness. Vaginal discharge (whitish discharge, wet mount collected) found.    Genitourinary Comments: IUD string clearly visible emanating from os  Musculoskeletal: Normal range of motion. She exhibits no edema.  Lymphadenopathy:       Head (right side): No submandibular adenopathy present.       Head (left side): No submandibular adenopathy present.    She  has no cervical adenopathy.    She has no axillary adenopathy.  Neurological: She is alert. She displays no tremor. No cranial nerve deficit. She exhibits normal muscle tone.  Reflex Scores:      Patellar reflexes are 2+ on the right side and 2+ on the left side. Skin: Skin is warm and dry. No bruising and no ecchymosis noted. No cyanosis. No pallor.  Numerous tattoos  Psychiatric: Her speech is normal and behavior is normal. Thought content normal. Her mood appears not anxious. She does not exhibit a depressed mood.    Assessment/Plan:   Problem List Items Addressed This Visit      Other   Vitamin D deficiency disease    Check today      Relevant Orders   VITAMIN D 25 Hydroxy (Vit-D Deficiency, Fractures)   Vitamin B12 deficiency    Check today  Relevant Orders   B12   Screen for colon cancer    Refer to GI for colonoscopy      Relevant Orders   Ambulatory referral to Gastroenterology   Preventative health care - Primary    USPSTF grade A and B recommendations reviewed with patient; age-appropriate recommendations, preventive care, screening tests, etc discussed and encouraged; healthy living encouraged; see AVS for patient education given to patient       Relevant Orders   CBC with Differential/Platelet   Comprehensive metabolic panel   Lipid panel   TSH   Obesity    Patient interested in bariatric clinic; referral entered; I do not recommend phentermine plus B12; see AVS      Relevant Orders   Amb Referral to Bariatric Surgery   Abnormal Pap smear of cervix    Check today      Relevant Orders   Pap IG and HPV (high risk) DNA detection    Other Visit Diagnoses    Hot flashes       Relevant Orders   LH   [place]   Vaginal discharge       Relevant Orders   Wet prep, genital       Meds ordered this encounter  Medications  . ibuprofen (ADVIL,MOTRIN) 200 MG tablet    Sig: Take 200 mg by mouth 2 (two) times daily.  . Turmeric 450 MG CAPS    Sig:  Take by mouth.  . Flaxseed, Linseed, (FLAX SEED OIL) 1000 MG CAPS    Sig: Take by mouth.   Orders Placed This Encounter  Procedures  . Wet prep, genital  . CBC with Differential/Platelet  . Comprehensive metabolic panel  . Lipid panel  . TSH  . LH  . FSH  . VITAMIN D 25 Hydroxy (Vit-D Deficiency, Fractures)  . B12  . Amb Referral to Bariatric Surgery    Referral Priority:   Routine    Referral Type:   Consultation    Number of Visits Requested:   1  . Ambulatory referral to Gastroenterology    Referral Priority:   Routine    Referral Type:   Consultation    Referral Reason:   Specialty Services Required    Number of Visits Requested:   1    Follow up plan: Return in about 1 year (around 07/24/2017) for complete physical.  An After Visit Summary was printed and given to the patient.

## 2016-07-24 NOTE — Assessment & Plan Note (Signed)
Patient interested in bariatric clinic; referral entered; I do not recommend phentermine plus B12; see AVS

## 2016-07-24 NOTE — Patient Instructions (Addendum)
Check out the information at familydoctor.org entitled "Nutrition for Weight Loss: What You Need to Know about Fad Diets" Try to lose between 1-2 pounds per week by taking in fewer calories and burning off more calories You can succeed by limiting portions, limiting foods dense in calories and fat, becoming more active, and drinking 8 glasses of water a day (64 ounces) Don't skip meals, especially breakfast, as skipping meals may alter your metabolism Do not use over-the-counter weight loss pills or gimmicks that claim rapid weight loss A healthy BMI (or body mass index) is between 18.5 and 24.9 You can calculate your ideal BMI at the [place] website [employer]Mon[employer]fr  We'll get labs and a pap smear today We'll refer you to the GI doctor and the bariatric clinic

## 2016-07-25 LAB — COMPREHENSIVE METABOLIC PANEL
ALBUMIN: 4.2 g/dL (ref 3.5–5.5)
ALT: 33 IU/L — ABNORMAL HIGH (ref 0–32)
AST: 30 IU/L (ref 0–40)
Albumin/Globulin Ratio: 1.6 (ref 1.2–2.2)
Alkaline Phosphatase: 72 IU/L (ref 39–117)
BUN / CREAT RATIO: 26 — AB (ref 9–23)
BUN: 19 mg/dL (ref 6–24)
Bilirubin Total: 0.3 mg/dL (ref 0.0–1.2)
CALCIUM: 9.6 mg/dL (ref 8.7–10.2)
CO2: 25 mmol/L (ref 18–29)
CREATININE: 0.72 mg/dL (ref 0.57–1.00)
Chloride: 101 mmol/L (ref 96–106)
GFR calc Af Amer: 113 mL/min/{1.73_m2} (ref >59)
GFR, EST NON AFRICAN AMERICAN: 98 mL/min/{1.73_m2} (ref >59)
GLOBULIN, TOTAL: 2.7 g/dL (ref 1.5–4.5)
GLUCOSE: 90 mg/dL (ref 65–99)
Potassium: 4 mmol/L (ref 3.5–5.2)
SODIUM: 140 mmol/L (ref 134–144)
TOTAL PROTEIN: 6.9 g/dL (ref 6.0–8.5)

## 2016-07-25 LAB — CBC WITH DIFFERENTIAL/PLATELET
BASOS ABS: 0 10*3/uL (ref 0.0–0.2)
Basos: 0 %
EOS (ABSOLUTE): 0.1 10*3/uL (ref 0.0–0.4)
Eos: 1 %
HEMOGLOBIN: 13.8 g/dL (ref 11.1–15.9)
Hematocrit: 41.4 % (ref 34.0–46.6)
IMMATURE GRANS (ABS): 0 10*3/uL (ref 0.0–0.1)
IMMATURE GRANULOCYTES: 0 %
LYMPHS: 33 %
Lymphocytes Absolute: 2.6 10*3/uL (ref 0.7–3.1)
MCH: 28.3 pg (ref 26.6–33.0)
MCHC: 33.3 g/dL (ref 31.5–35.7)
MCV: 85 fL (ref 79–97)
MONOCYTES: 8 %
Monocytes Absolute: 0.6 10*3/uL (ref 0.1–0.9)
NEUTROS ABS: 4.6 10*3/uL (ref 1.4–7.0)
NEUTROS PCT: 58 %
PLATELETS: 334 10*3/uL (ref 150–379)
RBC: 4.87 x10E6/uL (ref 3.77–5.28)
RDW: 13.7 % (ref 12.3–15.4)
WBC: 7.9 10*3/uL (ref 3.4–10.8)

## 2016-07-25 LAB — VITAMIN B12: Vitamin B-12: 799 pg/mL (ref 232–1245)

## 2016-07-25 LAB — LUTEINIZING HORMONE: LH: 30 m[IU]/mL

## 2016-07-25 LAB — TSH: TSH: 0.846 u[IU]/mL (ref 0.450–4.500)

## 2016-07-25 LAB — LIPID PANEL
CHOL/HDL RATIO: 2.6 ratio (ref 0.0–4.4)
CHOLESTEROL TOTAL: 144 mg/dL (ref 100–199)
HDL: 55 mg/dL (ref >39)
LDL CALC: 74 mg/dL (ref 0–99)
Triglycerides: 77 mg/dL (ref 0–149)
VLDL CHOLESTEROL CAL: 15 mg/dL (ref 5–40)

## 2016-07-25 LAB — VITAMIN D 25 HYDROXY (VIT D DEFICIENCY, FRACTURES): Vit D, 25-Hydroxy: 26.7 ng/mL — ABNORMAL LOW (ref 30.0–100.0)

## 2016-07-25 LAB — FOLLICLE STIMULATING HORMONE: FSH: 61 m[IU]/mL

## 2016-07-27 ENCOUNTER — Telehealth: Payer: Self-pay | Admitting: Gastroenterology

## 2016-07-27 NOTE — Telephone Encounter (Signed)
07/27/16 [employer] website for Screening Colonoscopy 45378 / Z12.11 Auth #: [id number].

## 2016-07-28 ENCOUNTER — Telehealth: Payer: Self-pay

## 2016-07-28 LAB — VAGINITIS/VAGINOSIS, DNA PROBE
CANDIDA SPECIES: NEGATIVE
Gardnerella vaginalis: NEGATIVE
Trichomonas vaginosis: NEGATIVE

## 2016-07-28 LAB — WET PREP, GENITAL

## 2016-07-28 MED ORDER — MELOXICAM 7.5 MG PO TABS: 7.5000 mg | ORAL_TABLET | Freq: Every day | ORAL | 1 refills | Status: DC | PRN

## 2016-07-28 NOTE — Telephone Encounter (Signed)
Arthritis pain in neck, back and hand. Pt was taking Advil and was not helping. Pt is wondering would you recommend prescribing her meloxicam to help with the pain; pt had this before and it helped with her back pain.

## 2016-07-28 NOTE — Telephone Encounter (Signed)
Sure, Rx for meloxicam sent Please advise her to not take any other NSAIDs while taking this medicine Plain acetaminophen (Tylenol) is okay per package directions

## 2016-07-29 LAB — PAP IG AND HPV HIGH-RISK
HPV, HIGH-RISK: NEGATIVE
PAP SMEAR COMMENT: 0

## 2016-07-30 NOTE — Telephone Encounter (Signed)
Pt.notified

## 2016-08-24 ENCOUNTER — Encounter: Payer: Self-pay | Admitting: *Deleted

## 2016-08-27 NOTE — Discharge Instructions (Signed)
General Anesthesia, Adult, Care After °These instructions provide you with information about caring for yourself after your procedure. Your health care provider may also give you more specific instructions. Your treatment has been planned according to current medical practices, but problems sometimes occur. Call your health care provider if you have any problems or questions after your procedure. °What can I expect after the procedure? °After the procedure, it is common to have: °· Vomiting. °· A sore throat. °· Mental slowness. ° °It is common to feel: °· Nauseous. °· Cold or shivery. °· Sleepy. °· Tired. °· Sore or achy, even in parts of your body where you did not have surgery. ° °Follow these instructions at home: °For at least 24 hours after the procedure: °· Do not: °? Participate in activities where you could fall or become injured. °? Drive. °? Use heavy machinery. °? Drink alcohol. °? Take sleeping pills or medicines that cause drowsiness. °? Make important decisions or sign legal documents. °? Take care of children on your own. °· Rest. °Eating and drinking °· If you vomit, drink water, juice, or soup when you can drink without vomiting. °· Drink enough fluid to keep your urine clear or pale yellow. °· Make sure you have little or no nausea before eating solid foods. °· Follow the diet recommended by your health care provider. °General instructions °· Have a responsible adult stay with you until you are awake and alert. °· Return to your normal activities as told by your health care provider. Ask your health care provider what activities are safe for you. °· Take over-the-counter and prescription medicines only as told by your health care provider. °· If you smoke, do not smoke without supervision. °· Keep all follow-up visits as told by your health care provider. This is important. °Contact a health care provider if: °· You continue to have nausea or vomiting at home, and medicines are not helpful. °· You  cannot drink fluids or start eating again. °· You cannot urinate after 8-12 hours. °· You develop a skin rash. °· You have fever. °· You have increasing redness at the site of your procedure. °Get help right away if: °· You have difficulty breathing. °· You have chest pain. °· You have unexpected bleeding. °· You feel that you are having a life-threatening or urgent problem. °This information is not intended to replace advice given to you by your health care provider. Make sure you discuss any questions you have with your health care provider. °Document Released: 06/15/2000 Document Revised: 08/12/2015 Document Reviewed: 02/21/2015 °Elsevier Interactive Patient Education © 2018 [employer] Inc. ° °

## 2016-08-28 ENCOUNTER — Ambulatory Visit: Payer: 59 | Admitting: Anesthesiology

## 2016-08-28 ENCOUNTER — Encounter
Admission: RE | Disposition: A | Discharge status: Home / Self Care | Payer: Self-pay | Source: Ambulatory Visit | Attending: Gastroenterology

## 2016-08-28 ENCOUNTER — Ambulatory Visit
Admission: RE | Admit: 2016-08-28 | Discharge: 2016-08-28 | Disposition: A | Discharge status: Home / Self Care | Payer: 59 | Source: Ambulatory Visit | Attending: Gastroenterology | Admitting: Gastroenterology

## 2016-08-28 DIAGNOSIS — Z1211 Encounter for screening for malignant neoplasm of colon: Secondary | ICD-10-CM | POA: Diagnosis not present

## 2016-08-28 DIAGNOSIS — Z87891 Personal history of nicotine dependence: Secondary | ICD-10-CM | POA: Diagnosis not present

## 2016-08-28 DIAGNOSIS — M5412 Radiculopathy, cervical region: Secondary | ICD-10-CM | POA: Diagnosis not present

## 2016-08-28 DIAGNOSIS — Z6835 Body mass index (BMI) 35.0-35.9, adult: Secondary | ICD-10-CM | POA: Insufficient documentation

## 2016-08-28 DIAGNOSIS — K641 Second degree hemorrhoids: Secondary | ICD-10-CM | POA: Diagnosis not present

## 2016-08-28 DIAGNOSIS — E559 Vitamin D deficiency, unspecified: Secondary | ICD-10-CM | POA: Insufficient documentation

## 2016-08-28 DIAGNOSIS — E669 Obesity, unspecified: Secondary | ICD-10-CM | POA: Insufficient documentation

## 2016-08-28 DIAGNOSIS — I10 Essential (primary) hypertension: Secondary | ICD-10-CM | POA: Insufficient documentation

## 2016-08-28 DIAGNOSIS — Z79899 Other long term (current) drug therapy: Secondary | ICD-10-CM | POA: Diagnosis not present

## 2016-08-28 DIAGNOSIS — M1990 Unspecified osteoarthritis, unspecified site: Secondary | ICD-10-CM | POA: Insufficient documentation

## 2016-08-28 HISTORY — PX: COLONOSCOPY WITH PROPOFOL: SHX5780

## 2016-08-28 HISTORY — DX: Unspecified osteoarthritis, unspecified site: M19.90

## 2016-08-28 HISTORY — DX: Dizziness and giddiness: R42

## 2016-08-28 SURGERY — COLONOSCOPY WITH PROPOFOL
Anesthesia: Monitor Anesthesia Care | Wound class: Contaminated

## 2016-08-28 MED ORDER — STERILE WATER FOR IRRIGATION IR SOLN
Status: DC | PRN
  Administered 2016-08-28: 09:00:00

## 2016-08-28 MED ORDER — LACTATED RINGERS IV SOLN
INTRAVENOUS | Status: DC
  Administered 2016-08-28: 09:00:00 via INTRAVENOUS

## 2016-08-28 MED ORDER — PROPOFOL 10 MG/ML IV BOLUS
INTRAVENOUS | Status: DC | PRN
  Administered 2016-08-28: 40 mg via INTRAVENOUS
  Administered 2016-08-28 (×3): 60 mg via INTRAVENOUS
  Administered 2016-08-28: 40 mg via INTRAVENOUS

## 2016-08-28 MED ORDER — LIDOCAINE HCL (CARDIAC) 20 MG/ML IV SOLN
INTRAVENOUS | Status: DC | PRN
  Administered 2016-08-28: 40 mg via INTRAVENOUS

## 2016-08-28 SURGICAL SUPPLY — 23 items
CANISTER SUCT 1200ML W/VALVE (MISCELLANEOUS) ×3 IMPLANT
CLIP HMST 235XBRD CATH ROT (MISCELLANEOUS) IMPLANT
CLIP RESOLUTION 360 11X235 (MISCELLANEOUS)
FCP ESCP3.2XJMB 240X2.8X (MISCELLANEOUS)
FORCEPS BIOP RAD 4 LRG CAP 4 (CUTTING FORCEPS) IMPLANT
FORCEPS BIOP RJ4 240 W/NDL (MISCELLANEOUS)
FORCEPS ESCP3.2XJMB 240X2.8X (MISCELLANEOUS) IMPLANT
GOWN CVR UNV OPN BCK APRN NK (MISCELLANEOUS) ×2 IMPLANT
GOWN ISOL THUMB LOOP REG UNIV (MISCELLANEOUS) ×6
INJECTOR VARIJECT VIN23 (MISCELLANEOUS) IMPLANT
KIT DEFENDO VALVE AND CONN (KITS) IMPLANT
KIT ENDO PROCEDURE OLY (KITS) ×3 IMPLANT
MARKER SPOT ENDO TATTOO 5ML (MISCELLANEOUS) IMPLANT
PAD GROUND ADULT SPLIT (MISCELLANEOUS) IMPLANT
PROBE APC STR FIRE (PROBE) IMPLANT
RETRIEVER NET ROTH 2.5X230 LF (MISCELLANEOUS) IMPLANT
SNARE SHORT THROW 13M SML OVAL (MISCELLANEOUS) IMPLANT
SNARE SHORT THROW 30M LRG OVAL (MISCELLANEOUS) IMPLANT
SNARE SNG USE RND 15MM (INSTRUMENTS) IMPLANT
SPOT EX ENDOSCOPIC TATTOO (MISCELLANEOUS)
TRAP ETRAP POLY (MISCELLANEOUS) IMPLANT
VARIJECT INJECTOR VIN23 (MISCELLANEOUS)
WATER STERILE IRR 250ML POUR (IV SOLUTION) ×3 IMPLANT

## 2016-08-28 NOTE — Anesthesia Procedure Notes (Signed)
Procedure Name: MAC Performed by: Cyndy Braver Pre-anesthesia Checklist: Patient identified, Emergency Drugs available, Suction available, Patient being monitored and Timeout performed Patient Re-evaluated:Patient Re-evaluated prior to inductionOxygen Delivery Method: Nasal cannula       

## 2016-08-28 NOTE — Transfer of Care (Signed)
Immediate Anesthesia Transfer of Care Note  Patient: Wendy Li  Procedure(s) Performed: Procedure(s): COLONOSCOPY WITH PROPOFOL (N/A)  Patient Location: PACU  Anesthesia Type: MAC  Level of Consciousness: awake, alert  and patient cooperative  Airway and Oxygen Therapy: Patient Spontanous Breathing and Patient connected to supplemental oxygen  Post-op Assessment: Post-op Vital signs reviewed, Patient's Cardiovascular Status Stable, Respiratory Function Stable, Patent Airway and No signs of Nausea or vomiting  Post-op Vital Signs: Reviewed and stable  Complications: No apparent anesthesia complications

## 2016-08-28 NOTE — Anesthesia Postprocedure Evaluation (Signed)
Anesthesia Post Note  Patient: Wendy Li  Procedure(s) Performed: Procedure(s) (LRB): COLONOSCOPY WITH PROPOFOL (N/A)  Patient location during evaluation: PACU Anesthesia Type: MAC Level of consciousness: awake and alert Pain management: pain level controlled Vital Signs Assessment: post-procedure vital signs reviewed and stable Respiratory status: spontaneous breathing, nonlabored ventilation, respiratory function stable and patient connected to nasal cannula oxygen Cardiovascular status: stable and blood pressure returned to baseline Anesthetic complications: no    Marshell Levan

## 2016-08-28 NOTE — H&P (Signed)
Wendy Lame, MD [address] [place],  [address] Phone: [phone] Fax : [phone]  Primary Care Physician:  Arnetha Courser, MD Primary Gastroenterologist:  Dr. Allen Norris  Pre-Procedure History & Physical: HPI:  Wendy Li is a 51 y.o. female is here for a screening colonoscopy.   Past Medical History:  Diagnosis Date  . Abnormal Pap smear of cervix 1994-2002   ASCUS, colposcopy, LEEP procedure-no abnormals since  . Arthritis    lower back  . Cervical radiculopathy at C6   . Elevated blood pressure   . Hypertension   . Obesity   . Vertigo    3-4 episodes/yr, last one 12/2015  . Vitamin B12 deficiency   . Vitamin D deficiency disease     Past Surgical History:  Procedure Laterality Date  . DILATION AND CURETTAGE OF UTERUS  1989  . HAND SURGERY  1997   cyst removal  . TONSILLECTOMY  1995    Prior to Admission medications   Medication Sig Start Date End Date Taking? Authorizing Provider  Calcium-Vitamin D-Vitamin K (CHEWABLE CALCIUM PO) Take by mouth daily.   Yes [provider]  Cholecalciferol (VITAMIN D) 2000 UNITS CAPS Take 2,000 Units by mouth once a week.   Yes [provider]  Flaxseed, Linseed, (FLAX SEED OIL) 1000 MG CAPS Take by mouth.   Yes [provider]  fluticasone (FLONASE) 50 MCG/ACT nasal spray Place 2 sprays into both nostrils daily. 07/24/16  Yes Lada, Satira Anis, MD  hydrochlorothiazide (HYDRODIURIL) 25 MG tablet TAKE 1 TABLET BY MOUTH  DAILY 06/26/16  Yes Lada, Satira Anis, MD  meloxicam (MOBIC) 7.5 MG tablet Take 1 tablet (7.5 mg total) by mouth daily as needed for pain. Do not take any other non-steroidal medicines with this 07/28/16  Yes Lada, Satira Anis, MD  Multiple Vitamin (MULTIVITAMIN) tablet Take 1 tablet by mouth daily. melaleuca   Yes [provider]  Turmeric 450 MG CAPS Take by mouth.   Yes [provider]    Allergies as of 07/24/2016 - Review Complete 07/24/2016    Allergen Reaction Noted  . Erythromycin Hives and Other (See Comments) 08/09/2014    Family History  Problem Relation Age of Onset  . Cancer Mother   . Hyperlipidemia Mother   . Hypertension Mother   . Atrial fibrillation Father   . Heart disease Father        angioplasty  . Hyperlipidemia Father   . Thyroid disease Father   . Arthritis Sister   . Hypertension Maternal Grandmother   . Blindness Maternal Grandmother   . Cancer Paternal Grandfather        testicular  . Breast cancer Neg Hx     Social History   Social History  . Marital status: Married    Spouse name: N/A  . Number of children: N/A  . Years of education: N/A   Occupational History  . Not on file.   Social History Main Topics  . Smoking status: Former Research scientist (life sciences)  . Smokeless tobacco: Never Used     Comment: quit pre-2000  . Alcohol use No     Comment: holidays  . Drug use: No  . Sexual activity: Not on file   Other Topics Concern  . Not on file   Social History Narrative  . No narrative on file    Review of Systems: See HPI, otherwise negative ROS  Physical Exam: BP 131/79   Pulse 90   Temp 98.6 F (  37 C) (Temporal)   Ht 5' 3.5" (1.613 m)   Wt 203 lb (92.1 kg)   SpO2 98%   BMI 35.40 kg/m  General:   Alert,  pleasant and cooperative in NAD Head:  Normocephalic and atraumatic. Neck:  Supple; no masses or thyromegaly. Lungs:  Clear throughout to auscultation.    Heart:  Regular rate and rhythm. Abdomen:  Soft, nontender and nondistended. Normal bowel sounds, without guarding, and without rebound.   Neurologic:  Alert and  oriented x4;  grossly normal neurologically.  Impression/Plan: Wendy Li is now here to undergo a screening colonoscopy.  Risks, benefits, and alternatives regarding colonoscopy have been reviewed with the patient.  Questions have been answered.  All parties agreeable.

## 2016-08-28 NOTE — Op Note (Signed)
[facility] Gastroenter[facility] Patient Name: Wendy Li Procedure Date: 08/28/2016 8:29 AM MRN: [id number] Account #: [id number] Date of Birth: [birth year] Admit Type: Outpatient Age: 51 Room: [facility] OR ROOM 01 Gender: Female Note Status: Finalized Procedure:            Colonoscopy Indications:          Screening for colorectal malignant neoplasm Providers:            Lucilla Lame MD, MD Referring MD:         Arnetha Courser (Referring MD) Medicines:            Propofol per Anesthesia Complications:        No immediate complications. Procedure:            Pre-Anesthesia Assessment:                       - Prior to the procedure, a History and Physical was                        performed, and patient medications and allergies were                        reviewed. The patient's tolerance of previous                        anesthesia was also reviewed. The risks and benefits of                        the procedure and the sedation options and risks were                        discussed with the patient. All questions were                        answered, and informed consent was obtained. Prior                        Anticoagulants: The patient has taken no previous                        anticoagulant or antiplatelet agents. ASA Grade                        Assessment: II - A patient with mild systemic disease.                        After reviewing the risks and benefits, the patient was                        deemed in satisfactory condition to undergo the                        procedure.                       After obtaining informed consent, the colonoscope was                        passed under direct vision. Throughout the procedure,  the patient's blood pressure, pulse, and oxygen                        saturations were monitored continuously. The Olympus                        Colonoscope [id number]) was introduced through the                     anus and advanced to the the cecum, identified by                        appendiceal orifice and ileocecal valve. The                        colonoscopy was performed without difficulty. The                        patient tolerated the procedure well. The quality of                        the bowel preparation was excellent. Findings:      The perianal and digital rectal examinations were normal.      Non-bleeding internal hemorrhoids were found during retroflexion. The       hemorrhoids were Grade II (internal hemorrhoids that prolapse but reduce       spontaneously). Impression:           - Non-bleeding internal hemorrhoids.                       - No specimens collected. Recommendation:       - Discharge patient to home.                       - Resume previous diet.                       - Continue present medications. Procedure Code(s):    --- Professional ---                       [phone], Colonoscopy, flexible; diagnostic, including                        collection of specimen(s) by brushing or washing, when                        performed (separate procedure) CPT copyright 2016 American Medical [employer]. All rights reserved. The codes documented in this report are preliminary and upon coder review may  be revised to meet current compliance requirements. Lucilla Lame MD, MD 08/28/2016 8:49:56 AM This report has been signed electronically. Number of Addenda: 0 Note Initiated On: 08/28/2016 8:29 AM Scope Withdrawal Time: 0 hours 6 minutes 30 seconds  Total Procedure Duration: 0 hours 10 minutes 50 seconds       [facility]

## 2016-08-28 NOTE — Anesthesia Preprocedure Evaluation (Signed)
Anesthesia Evaluation  Patient identified by MRN, date of birth, ID band Patient awake    Airway Mallampati: II  TM Distance: >3 FB Neck ROM: Full    Dental   Pulmonary former smoker,    Pulmonary exam normal        Cardiovascular hypertension, Pt. on medications Normal cardiovascular exam     Neuro/Psych    GI/Hepatic   Endo/Other    Renal/GU      Musculoskeletal  (+) Arthritis , Osteoarthritis,    Abdominal   Peds  Hematology   Anesthesia Other Findings   Reproductive/Obstetrics                             Anesthesia Physical Anesthesia Plan  ASA: II  Anesthesia Plan: MAC   Post-op Pain Management:    Induction: Intravenous  PONV Risk Score and Plan:   Airway Management Planned:   Additional Equipment:   Intra-op Plan:   Post-operative Plan:   Informed Consent: I have reviewed the patients History and Physical, chart, labs and discussed the procedure including the risks, benefits and alternatives for the proposed anesthesia with the patient or authorized representative who has indicated his/her understanding and acceptance.     Plan Discussed with: CRNA  Anesthesia Plan Comments:         Anesthesia Quick Evaluation

## 2016-09-04 ENCOUNTER — Encounter: Payer: Self-pay | Admitting: Gastroenterology

## 2016-09-14 ENCOUNTER — Other Ambulatory Visit: Payer: Self-pay | Admitting: Family Medicine

## 2016-09-28 ENCOUNTER — Other Ambulatory Visit: Payer: Self-pay

## 2016-09-28 ENCOUNTER — Telehealth: Payer: Self-pay | Admitting: Family Medicine

## 2016-09-28 MED ORDER — HYDROCHLOROTHIAZIDE 25 MG PO TABS: 25.0000 mg | ORAL_TABLET | Freq: Every day | ORAL | 1 refills | Status: DC

## 2016-09-28 NOTE — Telephone Encounter (Signed)
Last Cr and -lytes reviewed; Rx approved

## 2016-09-28 NOTE — Telephone Encounter (Signed)
Pt is requesting a refill on her Hydrochlorothiazide to be sent to [facility]. Pt states she did receive a letter in the mail stating that this request was denied.

## 2016-11-16 ENCOUNTER — Ambulatory Visit (INDEPENDENT_AMBULATORY_CARE_PROVIDER_SITE_OTHER): Payer: 59 | Admitting: Certified Nurse Midwife

## 2016-11-16 ENCOUNTER — Encounter: Payer: Self-pay | Admitting: Certified Nurse Midwife

## 2016-11-16 VITALS — BP 120/87 | HR 79 | Ht 63.0 in

## 2016-11-16 DIAGNOSIS — Z30432 Encounter for removal of intrauterine contraceptive device: Secondary | ICD-10-CM | POA: Diagnosis not present

## 2016-11-16 NOTE — Progress Notes (Signed)
  GYNECOLOGY OFFICE PROCEDURE NOTE  Wendy Li is a 51 y.o. No obstetric history on file. here for [place] IUD removal. No GYN concerns.  Last pap smear was on 07/24/2016 and was normal.  IUD Removal  Patient identified, informed consent performed, consent signed.  Patient was in the dorsal lithotomy position, normal external genitalia was noted.  A speculum was placed in the patient's vagina, normal discharge was noted, no lesions. The cervix was visualized, no lesions, no abnormal discharge.  The strings of the IUD were grasped and pulled using ring forceps. The IUD was removed in its entirety.   Patient tolerated the procedure well.    Patient is menopausal. Routine preventative health maintenance measures emphasized.   Philip Aspen, CNM

## 2016-11-16 NOTE — Patient Instructions (Signed)

## 2016-12-16 ENCOUNTER — Other Ambulatory Visit: Payer: Self-pay | Admitting: Family Medicine

## 2016-12-16 DIAGNOSIS — Z1231 Encounter for screening mammogram for malignant neoplasm of breast: Secondary | ICD-10-CM

## 2017-01-12 ENCOUNTER — Ambulatory Visit
Admission: RE | Admit: 2017-01-12 | Discharge: 2017-01-12 | Disposition: A | Discharge status: Home / Self Care | Payer: 59 | Source: Ambulatory Visit | Attending: Family Medicine | Admitting: Family Medicine

## 2017-01-12 ENCOUNTER — Encounter: Payer: Self-pay | Admitting: Radiology

## 2017-01-12 DIAGNOSIS — Z1231 Encounter for screening mammogram for malignant neoplasm of breast: Secondary | ICD-10-CM

## 2017-03-28 DIAGNOSIS — M150 Primary generalized (osteo)arthritis: Secondary | ICD-10-CM

## 2017-03-28 DIAGNOSIS — I10 Essential (primary) hypertension: Secondary | ICD-10-CM

## 2017-03-28 DIAGNOSIS — M159 Polyosteoarthritis, unspecified: Secondary | ICD-10-CM | POA: Insufficient documentation

## 2017-03-28 HISTORY — DX: Essential (primary) hypertension: I10

## 2017-03-30 ENCOUNTER — Ambulatory Visit: Payer: 59 | Admitting: Family Medicine

## 2017-03-30 ENCOUNTER — Ambulatory Visit
Admission: RE | Admit: 2017-03-30 | Discharge: 2017-03-30 | Disposition: A | Discharge status: Home / Self Care | Payer: Managed Care, Other (non HMO) | Source: Ambulatory Visit | Attending: Family Medicine | Admitting: Family Medicine

## 2017-03-30 ENCOUNTER — Encounter: Payer: Self-pay | Admitting: Family Medicine

## 2017-03-30 VITALS — BP 128/64 | HR 71 | Temp 98.5°F | Wt 201.0 lb

## 2017-03-30 DIAGNOSIS — M79645 Pain in left finger(s): Secondary | ICD-10-CM

## 2017-03-30 DIAGNOSIS — E6609 Other obesity due to excess calories: Secondary | ICD-10-CM

## 2017-03-30 DIAGNOSIS — M7989 Other specified soft tissue disorders: Secondary | ICD-10-CM

## 2017-03-30 DIAGNOSIS — E66812 Obesity, class 2: Secondary | ICD-10-CM

## 2017-03-30 DIAGNOSIS — M79646 Pain in unspecified finger(s): Secondary | ICD-10-CM | POA: Diagnosis present

## 2017-03-30 DIAGNOSIS — Z6836 Body mass index (BMI) 36.0-36.9, adult: Secondary | ICD-10-CM

## 2017-03-30 NOTE — Progress Notes (Signed)
BP 128/64   Pulse 71   Temp 98.5 F (36.9 C) (Oral)   Wt 201 lb (91.2 kg)   SpO2 98%   BMI 35.61 kg/m    Subjective:    Patient ID: Wendy Li, female    DOB: 1966-02-12, 52 y.o.   MRN: [id number]  HPI: Wendy Li is a 52 y.o. female  Chief Complaint  Patient presents with  . Hand Pain    middle finger left hand pain and swelling no trauma, onset 3 days    HPI Patient is here for an acute visit LEFT middle finger swollen and painful and warm She has a sore finger; tingling and very warm to touch; no injury No fevers this week but was sick with a virus after Christmas No other swollen joints; little knot on the ulnar aspect of the middle finger at the PIP No hx of gout (personal or family) Can try to make a fist, but cannot close all the way No metal hobbies Patient is right-handed, but uses both hands at work  She is working on weight loss; has lost 11 pounds; seeing bariatric specialist; doctor wants her to start strength training  Depression screen [facility] 2/9 03/30/2017 07/24/2016 02/06/2016 07/19/2015  Decreased Interest 0 0 0 0  Down, Depressed, Hopeless 0 0 0 0  PHQ - 2 Score 0 0 0 0    Relevant past medical, surgical, family and social history reviewed Past Medical History:  Diagnosis Date  . Abnormal Pap smear of cervix 1994-2002   ASCUS, colposcopy, LEEP procedure-no abnormals since  . Arthritis    lower back  . Cervical radiculopathy at C6   . Elevated blood pressure   . Hypertension   . Obesity   . Vertigo    3-4 episodes/yr, last one 12/2015  . Vitamin B12 deficiency   . Vitamin D deficiency disease    Past Surgical History:  Procedure Laterality Date  . COLONOSCOPY WITH PROPOFOL N/A 08/28/2016   Procedure: COLONOSCOPY WITH PROPOFOL;  Surgeon: Lucilla Lame, MD;  Location: [facility];  Service: Endoscopy;  Laterality: N/A;  . DILATION AND CURETTAGE OF UTERUS  1989  . HAND SURGERY  1997   cyst removal  . TONSILLECTOMY   1995   Family History  Problem Relation Age of Onset  . Cancer Mother   . Hyperlipidemia Mother   . Hypertension Mother   . Atrial fibrillation Father   . Heart disease Father        angioplasty  . Hyperlipidemia Father   . Thyroid disease Father   . Arthritis Sister   . Hypertension Maternal Grandmother   . Blindness Maternal Grandmother   . Cancer Paternal Grandfather        testicular  . Breast cancer Neg Hx    Social History   Tobacco Use  . Smoking status: Former Research scientist (life sciences)  . Smokeless tobacco: Never Used  . Tobacco comment: quit pre-2000  Substance Use Topics  . Alcohol use: No    Comment: holidays  . Drug use: No    Interim medical history since last visit reviewed. Allergies and medications reviewed  Review of Systems Per HPI unless specifically indicated above     Objective:    BP 128/64   Pulse 71   Temp 98.5 F (36.9 C) (Oral)   Wt 201 lb (91.2 kg)   SpO2 98%   BMI 35.61 kg/m   Wt Readings from Last 3 Encounters:  03/30/17 201 lb (91.2 kg)  08/28/16 203 lb (92.1 kg)  07/24/16 211 lb (95.7 kg)    Physical Exam  Constitutional: She appears well-developed and well-nourished. No distress.  Eyes: EOM are normal. No scleral icterus.  Neck: No thyromegaly present.  Cardiovascular: Normal rate.  Pulses:      Radial pulses are 2+ on the left side.  Pulmonary/Chest: Effort normal.  Abdominal: She exhibits no distension.  Musculoskeletal:       Left hand: She exhibits decreased range of motion, tenderness and swelling.  Left middle finger  Neurological:  Monofilament both hands fingertips normal  Skin: No pallor.  Psychiatric: She has a normal mood and affect. Her behavior is normal. Judgment and thought content normal.    Results for orders placed or performed in visit on 07/24/16  Wet prep, genital  Result Value Ref Range   Trichomonas Exam CANCELED   Vaginitis/Vaginosis, DNA Probe  Result Value Ref Range   Candida Species Negative Negative     Gardnerella vaginalis Negative Negative   Trichomonas vaginosis Negative Negative  CBC with Differential/Platelet  Result Value Ref Range   WBC 7.9 3.4 - 10.8 x10E3/uL   RBC 4.87 3.77 - 5.28 x10E6/uL   Hemoglobin 13.8 11.1 - 15.9 g/dL   Hematocrit 41.4 34.0 - 46.6 %   MCV 85 79 - 97 fL   MCH 28.3 26.6 - 33.0 pg   MCHC 33.3 31.5 - 35.7 g/dL   RDW 13.7 12.3 - 15.4 %   Platelets 334 150 - 379 x10E3/uL   Neutrophils 58 Not Estab. %   Lymphs 33 Not Estab. %   Monocytes 8 Not Estab. %   Eos 1 Not Estab. %   Basos 0 Not Estab. %   Neutrophils Absolute 4.6 1.4 - 7.0 x10E3/uL   Lymphocytes Absolute 2.6 0.7 - 3.1 x10E3/uL   Monocytes Absolute 0.6 0.1 - 0.9 x10E3/uL   EOS (ABSOLUTE) 0.1 0.0 - 0.4 x10E3/uL   Basophils Absolute 0.0 0.0 - 0.2 x10E3/uL   Immature Granulocytes 0 Not Estab. %   Immature Grans (Abs) 0.0 0.0 - 0.1 x10E3/uL  Comprehensive metabolic panel  Result Value Ref Range   Glucose 90 65 - 99 mg/dL   BUN 19 6 - 24 mg/dL   Creatinine, Ser 0.72 0.57 - 1.00 mg/dL   GFR calc non Af Amer 98 >59 mL/min/1.73   GFR calc Af Amer 113 >59 mL/min/1.73   BUN/Creatinine Ratio 26 (H) 9 - 23   Sodium 140 134 - 144 mmol/L   Potassium 4.0 3.5 - 5.2 mmol/L   Chloride 101 96 - 106 mmol/L   CO2 25 18 - 29 mmol/L   Calcium 9.6 8.7 - 10.2 mg/dL   Total Protein 6.9 6.0 - 8.5 g/dL   Albumin 4.2 3.5 - 5.5 g/dL   Globulin, Total 2.7 1.5 - 4.5 g/dL   Albumin/Globulin Ratio 1.6 1.2 - 2.2   Bilirubin Total 0.3 0.0 - 1.2 mg/dL   Alkaline Phosphatase 72 39 - 117 IU/L   AST 30 0 - 40 IU/L   ALT 33 (H) 0 - 32 IU/L  Lipid panel  Result Value Ref Range   Cholesterol, Total 144 100 - 199 mg/dL   Triglycerides 77 0 - 149 mg/dL   HDL 55 >39 mg/dL   VLDL Cholesterol Cal 15 5 - 40 mg/dL   LDL Calculated 74 0 - 99 mg/dL   Chol/HDL Ratio 2.6 0.0 - 4.4 ratio  TSH  Result Value Ref Range   TSH 0.846 0.450 - 4.500  uIU/mL  LH  Result Value Ref Range   LH 30.0 mIU/mL  [facility]  Result Value Ref Range    FSH 61.0 mIU/mL  VITAMIN D 25 Hydroxy (Vit-D Deficiency, Fractures)  Result Value Ref Range   Vit D, 25-Hydroxy 26.7 (L) 30.0 - 100.0 ng/mL  B12  Result Value Ref Range   Vitamin B-12 799 232 - 1,245 pg/mL  Pap IG and HPV (high risk) DNA detection  Result Value Ref Range   DIAGNOSIS: Comment    Specimen adequacy: Comment    Clinician Provided ICD10 Comment    Performed by: Comment    PAP Smear Comment .    Note: Comment    Test Methodology Comment    HPV, high-risk Negative Negative      Assessment & Plan:   Problem List Items Addressed This Visit      Other   Obesity    Working with bariatric specialist       Other Visit Diagnoses    Pain of left middle finger    -  Primary   start with labs and xrays; no jewelry on finger; no splint; to hand specialist if not resolved soon   Relevant Orders   Uric acid   CBC with Differential/Platelet   C-reactive protein   Basic metabolic panel   DG Finger Middle Left   Swelling of left middle finger       start with labs and xrays; no jewelry on finger; no splint; to hand specialist if not resolved soon   Relevant Orders   Uric acid   CBC with Differential/Platelet   C-reactive protein   Basic metabolic panel   DG Finger Middle Left       Follow up plan: No Follow-up on file.  An after-visit summary was printed and given to the patient at [facility].  Please see the patient instructions which may contain other information and recommendations beyond what is mentioned above in the assessment and plan.  No orders of the defined types were placed in this encounter.   Orders Placed This Encounter  Procedures  . DG Finger Middle Left  . Uric acid  . CBC with Differential/Platelet  . C-reactive protein  . Basic metabolic panel

## 2017-03-30 NOTE — Assessment & Plan Note (Signed)
Working with bariatric specialist

## 2017-03-30 NOTE — Patient Instructions (Signed)
Ibuprofen or aleve per package directions We'll get labs and xrays today If you have not heard anything from my staff in a week about any orders/referrals/studies from today, please contact us here to follow-up [phone]

## 2017-03-31 ENCOUNTER — Other Ambulatory Visit: Payer: Self-pay | Admitting: Family Medicine

## 2017-03-31 LAB — CBC WITH DIFFERENTIAL/PLATELET
BASOS ABS: 0 10*3/uL (ref 0.0–0.2)
Basos: 0 %
EOS (ABSOLUTE): 0.1 10*3/uL (ref 0.0–0.4)
Eos: 1 %
HEMOGLOBIN: 14.7 g/dL (ref 11.1–15.9)
Hematocrit: 42.7 % (ref 34.0–46.6)
Immature Grans (Abs): 0 10*3/uL (ref 0.0–0.1)
Immature Granulocytes: 0 %
LYMPHS ABS: 2.4 10*3/uL (ref 0.7–3.1)
Lymphs: 32 %
MCH: 29.3 pg (ref 26.6–33.0)
MCHC: 34.4 g/dL (ref 31.5–35.7)
MCV: 85 fL (ref 79–97)
MONOCYTES: 11 %
MONOS ABS: 0.8 10*3/uL (ref 0.1–0.9)
NEUTROS ABS: 4.2 10*3/uL (ref 1.4–7.0)
Neutrophils: 56 %
Platelets: 329 10*3/uL (ref 150–379)
RBC: 5.02 x10E6/uL (ref 3.77–5.28)
RDW: 13.3 % (ref 12.3–15.4)
WBC: 7.4 10*3/uL (ref 3.4–10.8)

## 2017-03-31 LAB — C-REACTIVE PROTEIN: CRP: 18.3 mg/L — AB (ref 0.0–4.9)

## 2017-03-31 LAB — BASIC METABOLIC PANEL
BUN / CREAT RATIO: 27 — AB (ref 9–23)
BUN: 20 mg/dL (ref 6–24)
CHLORIDE: 98 mmol/L (ref 96–106)
CO2: 26 mmol/L (ref 20–29)
Calcium: 9.9 mg/dL (ref 8.7–10.2)
Creatinine, Ser: 0.73 mg/dL (ref 0.57–1.00)
GFR calc non Af Amer: 96 mL/min/{1.73_m2} (ref >59)
GFR, EST AFRICAN AMERICAN: 110 mL/min/{1.73_m2} (ref >59)
Glucose: 83 mg/dL (ref 65–99)
Potassium: 4.2 mmol/L (ref 3.5–5.2)
Sodium: 142 mmol/L (ref 134–144)

## 2017-03-31 LAB — URIC ACID: URIC ACID: 6.5 mg/dL (ref 2.5–7.1)

## 2017-03-31 MED ORDER — INDOMETHACIN 50 MG PO CAPS: 50.0000 mg | ORAL_CAPSULE | Freq: Three times a day (TID) | ORAL | 0 refills | Status: DC

## 2017-05-17 ENCOUNTER — Other Ambulatory Visit: Payer: Self-pay | Admitting: Family Medicine

## 2017-07-30 ENCOUNTER — Encounter: Payer: Self-pay | Admitting: Family Medicine

## 2017-07-30 ENCOUNTER — Other Ambulatory Visit: Payer: Self-pay | Admitting: Family Medicine

## 2017-07-30 ENCOUNTER — Ambulatory Visit (INDEPENDENT_AMBULATORY_CARE_PROVIDER_SITE_OTHER): Payer: Managed Care, Other (non HMO) | Admitting: Family Medicine

## 2017-07-30 VITALS — BP 118/76 | HR 94 | Temp 98.2°F | Resp 14 | Ht 63.0 in | Wt 182.8 lb

## 2017-07-30 DIAGNOSIS — R590 Localized enlarged lymph nodes: Secondary | ICD-10-CM | POA: Diagnosis not present

## 2017-07-30 DIAGNOSIS — Z124 Encounter for screening for malignant neoplasm of cervix: Secondary | ICD-10-CM

## 2017-07-30 DIAGNOSIS — Z0000 Encounter for general adult medical examination without abnormal findings: Secondary | ICD-10-CM

## 2017-07-30 DIAGNOSIS — Z6831 Body mass index (BMI) 31.0-31.9, adult: Secondary | ICD-10-CM | POA: Diagnosis not present

## 2017-07-30 DIAGNOSIS — H9191 Unspecified hearing loss, right ear: Secondary | ICD-10-CM

## 2017-07-30 DIAGNOSIS — E559 Vitamin D deficiency, unspecified: Secondary | ICD-10-CM

## 2017-07-30 DIAGNOSIS — E538 Deficiency of other specified B group vitamins: Secondary | ICD-10-CM

## 2017-07-30 DIAGNOSIS — E6609 Other obesity due to excess calories: Secondary | ICD-10-CM | POA: Diagnosis not present

## 2017-07-30 DIAGNOSIS — H547 Unspecified visual loss: Secondary | ICD-10-CM | POA: Diagnosis not present

## 2017-07-30 DIAGNOSIS — R87610 Atypical squamous cells of undetermined significance on cytologic smear of cervix (ASC-US): Secondary | ICD-10-CM | POA: Diagnosis not present

## 2017-07-30 DIAGNOSIS — D485 Neoplasm of uncertain behavior of skin: Secondary | ICD-10-CM | POA: Diagnosis not present

## 2017-07-30 NOTE — Progress Notes (Signed)
BP 118/76   Pulse 94   Temp 98.2 F (36.8 C) (Oral)   Resp 14   Ht 5' 3"  (1.6 m)   Wt 182 lb 12.8 oz (82.9 kg)   SpO2 97%   BMI 32.38 kg/m    Subjective:    Patient ID: Wendy Li, female    DOB: Dec 30, 1965, 52 y.o.   MRN: [id number]  HPI: Wendy Li is a 52 y.o. female  Chief Complaint  Patient presents with  . Annual Exam    pt wants pap, knot on vagina    HPI Patient is here for a physical; however, she has multiple other issues going on She found a tick on her bum about a week ago; husband looked at it; knot there; itches No fevers, no neck stiffness [place] tick she found, but carries bacteria she found Knot in the inguinal crease; right side; just noticed a week ago but says that was BEFORE the tick bite Family history changes Finger is not swelling any more  USPSTF grade A and B recommendations Depression:  Depression screen [facility] 2/9 07/30/2017 03/30/2017 07/24/2016 02/06/2016 07/19/2015  Decreased Interest 0 0 0 0 0  Down, Depressed, Hopeless 0 0 0 0 0  PHQ - 2 Score 0 0 0 0 0   Hypertension: BP Readings from Last 3 Encounters:  07/30/17 118/76  03/30/17 128/64  11/16/16 120/87   Obesity: wokring on weight loss, since November; BMI from 36.2 to 31.9 Wt Readings from Last 3 Encounters:  07/30/17 182 lb 12.8 oz (82.9 kg)  03/30/17 201 lb (91.2 kg)  08/28/16 203 lb (92.1 kg)   BMI Readings from Last 3 Encounters:  07/30/17 32.38 kg/m  03/30/17 35.61 kg/m  11/16/16 35.96 kg/m    Skin cancer: scaling place on the right side of the nose Lung cancer:  Quit for 23 years Breast cancer: UTD, October Colorectal cancer: June 2018; 10 year pass Cervical cancer screening: today BRCA gene screening: family hx of breast and/or ovarian cancer and/or metastatic prostate cancer? Paternal aunt stage IV at age 58 HIV, hep B, hep C: already done, no new exposures STD testing and prevention (chl/gon/syphilis): not indicated Intimate partner  violence: no abuse Contraception: postmenopausal at least one year; black cohosh helping hot flashes and moodines Osteoporosis: n/a Fall prevention/vitamin D: discussed; sitting outside for lunch; 30 SPF in makeup Immunizations: UTD;  Diet: no red meat since December Exercise: low weights, many reps, circuit situation; does a lot of walking at work, wearing step counter on wrist, 5-9k steps a day Alcohol: none Tobacco use: quit 23 years ago AAA: n/a Aspirin: don't take Glucose:  Glucose  Date Value Ref Range Status  03/30/2017 83 65 - 99 mg/dL Final  07/24/2016 90 65 - 99 mg/dL Final  07/19/2015 85 65 - 99 mg/dL Final   Lipids:  Lab Results  Component Value Date   CHOL 144 07/24/2016   CHOL 154 07/19/2015   Lab Results  Component Value Date   HDL 55 07/24/2016   HDL 56 07/19/2015   Lab Results  Component Value Date   LDLCALC 74 07/24/2016   LDLCALC 86 07/19/2015   Lab Results  Component Value Date   TRIG 77 07/24/2016   TRIG 58 07/19/2015   Lab Results  Component Value Date   CHOLHDL 2.6 07/24/2016   No results found for: LDLDIRECT   Depression screen [facility] 2/9 07/30/2017 03/30/2017 07/24/2016 02/06/2016 07/19/2015  Decreased Interest 0 0 0 0 0  Down, Depressed, Hopeless 0 0 0 0 0  PHQ - 2 Score 0 0 0 0 0    Relevant past medical, surgical, family and social history reviewed Past Medical History:  Diagnosis Date  . Abnormal Pap smear of cervix 1994-2002   ASCUS, colposcopy, LEEP procedure-no abnormals since  . Arthritis    lower back  . Cervical radiculopathy at C6   . Elevated blood pressure   . Hypertension   . Obesity   . Vertigo    3-4 episodes/yr, last one 12/2015  . Vitamin B12 deficiency   . Vitamin D deficiency disease    Past Surgical History:  Procedure Laterality Date  . COLONOSCOPY WITH PROPOFOL N/A 08/28/2016   Procedure: COLONOSCOPY WITH PROPOFOL;  Surgeon: Lucilla Lame, MD;  Location: [facility];  Service: Endoscopy;  Laterality:  N/A;  . DILATION AND CURETTAGE OF UTERUS  1989  . HAND SURGERY  1997   cyst removal  . TONSILLECTOMY  1995   Family History  Problem Relation Age of Onset  . Cancer Mother   . Hyperlipidemia Mother   . Hypertension Mother   . Atrial fibrillation Father   . Heart disease Father        angioplasty  . Hyperlipidemia Father   . Thyroid disease Father   . Arthritis Sister   . Hypertension Maternal Grandmother   . Blindness Maternal Grandmother   . Cancer Paternal Grandfather        testicular  . Cancer Paternal Aunt   . Breast cancer Paternal Aunt   MD note: two deaths recently; both maternal grandparents, in the their late 73s; GF broke hip; GM went downhill, quit eating Paternal aunt, breast cancer, stage 4 at age 43; leukemia and testicular cancer; no known ovarian cancer  Social History   Tobacco Use  . Smoking status: Former Research scientist (life sciences)  . Smokeless tobacco: Never Used  . Tobacco comment: quit pre-2000  Substance Use Topics  . Alcohol use: No    Comment: holidays  . Drug use: No    Interim medical history since last visit reviewed. Allergies and medications reviewed  Review of Systems  Constitutional: Negative for fever and unexpected weight change.  HENT: Positive for hearing loss (lost her hearing for a bit when her glucose bottomed out after her daughter had her baby; both eyes go blind).   Eyes: Positive for visual disturbance (both eyes will go blind; episodes on and off since 2009 or so; bounces back with she gets sugar in her).  Respiratory: Negative for shortness of breath.   Cardiovascular: Negative for chest pain.  Gastrointestinal: Negative for blood in stool.  Endocrine: Positive for polydipsia (sometimes at work).  Genitourinary: Negative for hematuria.  Musculoskeletal: Positive for arthralgias (feeling much better since losing weight, taking turmeric).       Left middle finger knot is almost completely gone  Skin:       Place on the note, bump on the  right buttock  Neurological: Positive for syncope.  Hematological: Positive for adenopathy (right inguinal).  Psychiatric/Behavioral: Negative for dysphoric mood.   Per HPI unless specifically indicated above     Objective:    BP 118/76   Pulse 94   Temp 98.2 F (36.8 C) (Oral)   Resp 14   Ht 5' 3"  (1.6 m)   Wt 182 lb 12.8 oz (82.9 kg)   SpO2 97%   BMI 32.38 kg/m   Wt Readings from Last 3 Encounters:  07/30/17 182 lb 12.8  oz (82.9 kg)  03/30/17 201 lb (91.2 kg)  08/28/16 203 lb (92.1 kg)    Physical Exam  Constitutional: She appears well-developed and well-nourished.  HENT:  Head: Normocephalic and atraumatic.  Eyes: Conjunctivae and EOM are normal. Right eye exhibits no hordeolum. Left eye exhibits no hordeolum. No scleral icterus.  Neck: Carotid bruit is not present. No thyromegaly present.  Cardiovascular: Normal rate, regular rhythm, S1 normal, S2 normal and normal heart sounds.  No extrasystoles are present.  Pulmonary/Chest: Effort normal and breath sounds normal. No respiratory distress. Right breast exhibits no inverted nipple, no mass, no nipple discharge, no skin change and no tenderness. Left breast exhibits no inverted nipple, no mass, no nipple discharge, no skin change and no tenderness. Breasts are symmetrical.  Abdominal: Soft. Normal appearance and bowel sounds are normal. She exhibits no distension, no abdominal bruit, no pulsatile midline mass and no mass. There is no hepatosplenomegaly. There is no tenderness. No hernia.  Genitourinary: Uterus normal. Pelvic exam was performed with patient prone. There is no rash or lesion on the right labia. There is no rash or lesion on the left labia. Cervix exhibits no motion tenderness. Right adnexum displays no mass, no tenderness and no fullness. Left adnexum displays no mass, no tenderness and no fullness.  Musculoskeletal: Normal range of motion. She exhibits no edema.  Lymphadenopathy:       Head (right side): No  submandibular adenopathy present.       Head (left side): No submandibular adenopathy present.    She has no cervical adenopathy.    She has no axillary adenopathy.       Right: Inguinal adenopathy present.  Neurological: She is alert. She displays no tremor. No cranial nerve deficit. She exhibits normal muscle tone. Gait normal.  Skin: Skin is warm and dry. No bruising and no ecchymosis noted. No cyanosis. No pallor.  Right side of the nose, erythematous; firm indurated papule on the right buttock; erythematous rash on left great toe  Psychiatric: Her speech is normal and behavior is normal. Thought content normal. Her mood appears not anxious. She does not exhibit a depressed mood.    Results for orders placed or performed in visit on 03/30/17  Uric acid  Result Value Ref Range   Uric Acid 6.5 2.5 - 7.1 mg/dL  CBC with Differential/Platelet  Result Value Ref Range   WBC 7.4 3.4 - 10.8 x10E3/uL   RBC 5.02 3.77 - 5.28 x10E6/uL   Hemoglobin 14.7 11.1 - 15.9 g/dL   Hematocrit 42.7 34.0 - 46.6 %   MCV 85 79 - 97 fL   MCH 29.3 26.6 - 33.0 pg   MCHC 34.4 31.5 - 35.7 g/dL   RDW 13.3 12.3 - 15.4 %   Platelets 329 150 - 379 x10E3/uL   Neutrophils 56 Not Estab. %   Lymphs 32 Not Estab. %   Monocytes 11 Not Estab. %   Eos 1 Not Estab. %   Basos 0 Not Estab. %   Neutrophils Absolute 4.2 1.4 - 7.0 x10E3/uL   Lymphocytes Absolute 2.4 0.7 - 3.1 x10E3/uL   Monocytes Absolute 0.8 0.1 - 0.9 x10E3/uL   EOS (ABSOLUTE) 0.1 0.0 - 0.4 x10E3/uL   Basophils Absolute 0.0 0.0 - 0.2 x10E3/uL   Immature Granulocytes 0 Not Estab. %   Immature Grans (Abs) 0.0 0.0 - 0.1 x10E3/uL  C-reactive protein  Result Value Ref Range   CRP 18.3 (H) 0.0 - 4.9 mg/L  Basic metabolic panel  Result  Value Ref Range   Glucose 83 65 - 99 mg/dL   BUN 20 6 - 24 mg/dL   Creatinine, Ser 0.73 0.57 - 1.00 mg/dL   GFR calc non Af Amer 96 >59 mL/min/1.73   GFR calc Af Amer 110 >59 mL/min/1.73   BUN/Creatinine Ratio 27 (H) 9 -  23   Sodium 142 134 - 144 mmol/L   Potassium 4.2 3.5 - 5.2 mmol/L   Chloride 98 96 - 106 mmol/L   CO2 26 20 - 29 mmol/L   Calcium 9.9 8.7 - 10.2 mg/dL      Assessment & Plan:   Problem List Items Addressed This Visit      Other   Obesity   Relevant Orders   Hemoglobin A1c   Vitamin D deficiency disease    Check level      Relevant Orders   VITAMIN D 25 Hydroxy (Vit-D Deficiency, Fractures)   Vitamin B12 deficiency    Check level      Relevant Orders   Vitamin B12   Screening for cervical cancer    Check pap smear      Relevant Orders   Pap IG and HPV (high risk) DNA detection   Preventative health care - Primary    USPSTF grade A and B recommendations reviewed with patient; age-appropriate recommendations, preventive care, screening tests, etc discussed and encouraged; healthy living encouraged; see AVS for patient education given to patient      Relevant Orders   Comprehensive metabolic panel   CBC with Differential/Platelet   Lipid panel   TSH   Abnormal Pap smear of cervix    Ascus, leep, cryo, several normals since then       Other Visit Diagnoses    Inguinal lymphadenopathy       RIGHT; will get ultrasound, may follow with CT scan   Relevant Orders   [place] RT LOWER EXTREM LTD SOFT TISSUE NON VASCULAR   Neoplasm of uncertain behavior of skin of face       Relevant Orders   Ambulatory referral to Dermatology   Blindness       Relevant Orders   Ambulatory referral to Neurology   Hearing loss of right ear, unspecified hearing loss type       Relevant Orders   Ambulatory referral to Neurology       Follow up plan: Return in about 1 year (around 07/31/2018) for complete physical.  An after-visit summary was printed and given to the patient at [facility].  Please see the patient instructions which may contain other information and recommendations beyond what is mentioned above in the assessment and plan.  No orders of the defined types were placed in  this encounter.   Orders Placed This Encounter  Procedures  . [place] RT LOWER EXTREM LTD SOFT TISSUE NON VASCULAR  . Comprehensive metabolic panel  . CBC with Differential/Platelet  . Hemoglobin A1c  . Lipid panel  . TSH  . Vitamin B12  . VITAMIN D 25 Hydroxy (Vit-D Deficiency, Fractures)  . Ambulatory referral to Dermatology  . Ambulatory referral to Neurology

## 2017-07-30 NOTE — Patient Instructions (Addendum)
Consider getting the new shingles vaccine called Shingrix; that is available for individuals 52 years of age and older, and is recommended even if you have had shingles in the past and/or already received the old shingles vaccine (Zostavax); it is a two-part series, and is available at many local pharmacies We'll have you see the dermatologist and the neurologist Please do have fasting labs soon If you have not heard anything from my staff in a week about any orders/referrals/studies from today, please contact us here to follow-up [phone] Check out the information at [facility]org entitled "Nutrition for Weight Loss: What You Need to Know about Fad Diets" Try to lose between 1-2 pounds per week by taking in fewer calories and burning off more calories You can succeed by limiting portions, limiting foods dense in calories and fat, becoming more active, and drinking 8 glasses of water a day (64 ounces) Don't skip meals, especially breakfast, as skipping meals may alter your metabolism Do not use over-the-counter weight loss pills or gimmicks that claim rapid weight loss A healthy BMI (or body mass index) is between 18.5 and 24.9 You can calculate your ideal BMI at the [place] website [employer]fr  Health Maintenance, Female Adopting a healthy lifestyle and getting preventive care can go a long way to promote health and wellness. Talk with your health care provider about what schedule of regular examinations is right for you. This is a good chance for you to check in with your provider about disease prevention and staying healthy. In between checkups, there are plenty of things you can do on your own. Experts have done a lot of research about which lifestyle changes and preventive measures are most likely to keep you healthy. Ask your health care provider for more information. Weight and diet Eat a healthy diet  Be sure to include plenty of  vegetables, fruits, low-fat dairy products, and lean protein.  Do not eat a lot of foods high in solid fats, added sugars, or salt.  Get regular exercise. This is one of the most important things you can do for your health. ? Most adults should exercise for at least 150 minutes each week. The exercise should increase your heart rate and make you sweat (moderate-intensity exercise). ? Most adults should also do strengthening exercises at least twice a week. This is in addition to the moderate-intensity exercise.  Maintain a healthy weight  Body mass index (BMI) is a measurement that can be used to identify possible weight problems. It estimates body fat based on height and weight. Your health care provider can help determine your BMI and help you achieve or maintain a healthy weight.  For females 23 years of age and older: ? A BMI below 18.5 is considered underweight. ? A BMI of 18.5 to 24.9 is normal. ? A BMI of 25 to 29.9 is considered overweight. ? A BMI of 30 and above is considered obese.  Watch levels of cholesterol and blood lipids  You should start having your blood tested for lipids and cholesterol at 52 years of age, then have this test every 5 years.  You may need to have your cholesterol levels checked more often if: ? Your lipid or cholesterol levels are high. ? You are older than 52 years of age. ? You are at high risk for heart disease.  Cancer screening Lung Cancer  Lung cancer screening is recommended for adults 9-32 years old who are at high risk for lung cancer because of a history  of smoking.  A yearly low-dose CT scan of the lungs is recommended for people who: ? Currently smoke. ? Have quit within the past 15 years. ? Have at least a 30-pack-year history of smoking. A pack year is smoking an average of one pack of cigarettes a day for 1 year.  Yearly screening should continue until it has been 15 years since you quit.  Yearly screening should stop if you  develop a health problem that would prevent you from having lung cancer treatment.  Breast Cancer  Practice breast self-awareness. This means understanding how your breasts normally appear and feel.  It also means doing regular breast self-exams. Let your health care provider know about any changes, no matter how small.  If you are in your 20s or 30s, you should have a clinical breast exam (CBE) by a health care provider every 1-3 years as part of a regular health exam.  If you are 53 or older, have a CBE every year. Also consider having a breast X-ray (mammogram) every year.  If you have a family history of breast cancer, talk to your health care provider about genetic screening.  If you are at high risk for breast cancer, talk to your health care provider about having an MRI and a mammogram every year.  Breast cancer gene (BRCA) assessment is recommended for women who have family members with BRCA-related cancers. BRCA-related cancers include: ? Breast. ? Ovarian. ? Tubal. ? Peritoneal cancers.  Results of the assessment will determine the need for genetic counseling and BRCA1 and BRCA2 testing.  Cervical Cancer Your health care provider may recommend that you be screened regularly for cancer of the pelvic organs (ovaries, uterus, and vagina). This screening involves a pelvic examination, including checking for microscopic changes to the surface of your cervix (Pap test). You may be encouraged to have this screening done every 3 years, beginning at age 75.  For women ages 54-65, health care providers may recommend pelvic exams and Pap testing every 3 years, or they may recommend the Pap and pelvic exam, combined with testing for human papilloma virus (HPV), every 5 years. Some types of HPV increase your risk of cervical cancer. Testing for HPV may also be done on women of any age with unclear Pap test results.  Other health care providers may not recommend any screening for nonpregnant  women who are considered low risk for pelvic cancer and who do not have symptoms. Ask your health care provider if a screening pelvic exam is right for you.  If you have had past treatment for cervical cancer or a condition that could lead to cancer, you need Pap tests and screening for cancer for at least 20 years after your treatment. If Pap tests have been discontinued, your risk factors (such as having a new sexual partner) need to be reassessed to determine if screening should resume. Some women have medical problems that increase the chance of getting cervical cancer. In these cases, your health care provider may recommend more frequent screening and Pap tests.  Colorectal Cancer  This type of cancer can be detected and often prevented.  Routine colorectal cancer screening usually begins at 52 years of age and continues through 52 years of age.  Your health care provider may recommend screening at an earlier age if you have risk factors for colon cancer.  Your health care provider may also recommend using home test kits to check for hidden blood in the stool.  A small camera  at the end of a tube can be used to examine your colon directly (sigmoidoscopy or colonoscopy). This is done to check for the earliest forms of colorectal cancer.  Routine screening usually begins at age 75.  Direct examination of the colon should be repeated every 5-10 years through 52 years of age. However, you may need to be screened more often if early forms of precancerous polyps or small growths are found.  Skin Cancer  Check your skin from head to toe regularly.  Tell your health care provider about any new moles or changes in moles, especially if there is a change in a mole's shape or color.  Also tell your health care provider if you have a mole that is larger than the size of a pencil eraser.  Always use sunscreen. Apply sunscreen liberally and repeatedly throughout the day.  Protect yourself by  wearing long sleeves, pants, a wide-brimmed hat, and sunglasses whenever you are outside.  Heart disease, diabetes, and high blood pressure  High blood pressure causes heart disease and increases the risk of stroke. High blood pressure is more likely to develop in: ? People who have blood pressure in the high end of the normal range (130-139/85-89 mm Hg). ? People who are overweight or obese. ? People who are African American.  If you are 68-4 years of age, have your blood pressure checked every 3-5 years. If you are 51 years of age or older, have your blood pressure checked every year. You should have your blood pressure measured twice-once when you are at a hospital or clinic, and once when you are not at a hospital or clinic. Record the average of the two measurements. To check your blood pressure when you are not at a hospital or clinic, you can use: ? An automated blood pressure machine at a pharmacy. ? A home blood pressure monitor.  If you are between 46 years and 53 years old, ask your health care provider if you should take aspirin to prevent strokes.  Have regular diabetes screenings. This involves taking a blood sample to check your fasting blood sugar level. ? If you are at a normal weight and have a low risk for diabetes, have this test once every three years after 52 years of age. ? If you are overweight and have a high risk for diabetes, consider being tested at a younger age or more often. Preventing infection Hepatitis B  If you have a higher risk for hepatitis B, you should be screened for this virus. You are considered at high risk for hepatitis B if: ? You were born in a country where hepatitis B is common. Ask your health care provider which countries are considered high risk. ? Your parents were born in a high-risk country, and you have not been immunized against hepatitis B (hepatitis B vaccine). ? You have HIV or AIDS. ? You use needles to inject street drugs. ? You  live with someone who has hepatitis B. ? You have had sex with someone who has hepatitis B. ? You get hemodialysis treatment. ? You take certain medicines for conditions, including cancer, organ transplantation, and autoimmune conditions.  Hepatitis C  Blood testing is recommended for: ? Everyone born from [birth year] through 1965. ? Anyone with known risk factors for hepatitis C.  Sexually transmitted infections (STIs)  You should be screened for sexually transmitted infections (STIs) including gonorrhea and chlamydia if: ? You are sexually active and are younger than 52 years of age. ?  You are older than 52 years of age and your health care provider tells you that you are at risk for this type of infection. ? Your sexual activity has changed since you were last screened and you are at an increased risk for chlamydia or gonorrhea. Ask your health care provider if you are at risk.  If you do not have HIV, but are at risk, it may be recommended that you take a prescription medicine daily to prevent HIV infection. This is called pre-exposure prophylaxis (PrEP). You are considered at risk if: ? You are sexually active and do not regularly use condoms or know the HIV status of your partner(s). ? You take drugs by injection. ? You are sexually active with a partner who has HIV.  Talk with your health care provider about whether you are at high risk of being infected with HIV. If you choose to begin PrEP, you should first be tested for HIV. You should then be tested every 3 months for as long as you are taking PrEP. Pregnancy  If you are premenopausal and you may become pregnant, ask your health care provider about preconception counseling.  If you may become pregnant, take 400 to 800 micrograms (mcg) of folic acid every day.  If you want to prevent pregnancy, talk to your health care provider about birth control (contraception). Osteoporosis and menopause  Osteoporosis is a disease in which the  bones lose minerals and strength with aging. This can result in serious bone fractures. Your risk for osteoporosis can be identified using a bone density scan.  If you are 53 years of age or older, or if you are at risk for osteoporosis and fractures, ask your health care provider if you should be screened.  Ask your health care provider whether you should take a calcium or vitamin D supplement to lower your risk for osteoporosis.  Menopause may have certain physical symptoms and risks.  Hormone replacement therapy may reduce some of these symptoms and risks. Talk to your health care provider about whether hormone replacement therapy is right for you. Follow these instructions at home:  Schedule regular health, dental, and eye exams.  Stay current with your immunizations.  Do not use any tobacco products including cigarettes, chewing tobacco, or electronic cigarettes.  If you are pregnant, do not drink alcohol.  If you are breastfeeding, limit how much and how often you drink alcohol.  Limit alcohol intake to no more than 1 drink per day for nonpregnant women. One drink equals 12 ounces of beer, 5 ounces of wine, or 1 ounces of hard liquor.  Do not use street drugs.  Do not share needles.  Ask your health care provider for help if you need support or information about quitting drugs.  Tell your health care provider if you often feel depressed.  Tell your health care provider if you have ever been abused or do not feel safe at home. This information is not intended to replace advice given to you by your health care provider. Make sure you discuss any questions you have with your health care provider. Document Released: 09/22/2010 Document Revised: 08/15/2015 Document Reviewed: 12/11/2014 Elsevier Interactive Patient Education  [employer].

## 2017-07-30 NOTE — Assessment & Plan Note (Signed)
Check level 

## 2017-07-30 NOTE — Assessment & Plan Note (Signed)
Ascus, leep, cryo, several normals since then

## 2017-07-30 NOTE — Assessment & Plan Note (Signed)
Check pap smear

## 2017-07-30 NOTE — Assessment & Plan Note (Signed)
USPSTF grade A and B recommendations reviewed with patient; age-appropriate recommendations, preventive care, screening tests, etc discussed and encouraged; healthy living encouraged; see AVS for patient education given to patient  

## 2017-08-03 LAB — CBC WITH DIFFERENTIAL/PLATELET
BASOS ABS: 0 10*3/uL (ref 0.0–0.2)
BASOS: 0 %
EOS (ABSOLUTE): 0.1 10*3/uL (ref 0.0–0.4)
Eos: 1 %
Hematocrit: 43.6 % (ref 34.0–46.6)
Hemoglobin: 14.7 g/dL (ref 11.1–15.9)
Immature Grans (Abs): 0 10*3/uL (ref 0.0–0.1)
Immature Granulocytes: 0 %
LYMPHS ABS: 2.3 10*3/uL (ref 0.7–3.1)
Lymphs: 33 %
MCH: 29.3 pg (ref 26.6–33.0)
MCHC: 33.7 g/dL (ref 31.5–35.7)
MCV: 87 fL (ref 79–97)
Monocytes Absolute: 0.5 10*3/uL (ref 0.1–0.9)
Monocytes: 7 %
NEUTROS ABS: 4.1 10*3/uL (ref 1.4–7.0)
Neutrophils: 59 %
PLATELETS: 309 10*3/uL (ref 150–379)
RBC: 5.01 x10E6/uL (ref 3.77–5.28)
RDW: 12.9 % (ref 12.3–15.4)
WBC: 6.9 10*3/uL (ref 3.4–10.8)

## 2017-08-03 LAB — COMPREHENSIVE METABOLIC PANEL
ALT: 17 IU/L (ref 0–32)
AST: 13 IU/L (ref 0–40)
Albumin/Globulin Ratio: 1.7 (ref 1.2–2.2)
Albumin: 4.3 g/dL (ref 3.5–5.5)
Alkaline Phosphatase: 68 IU/L (ref 39–117)
BUN / CREAT RATIO: 31 — AB (ref 9–23)
BUN: 24 mg/dL (ref 6–24)
CHLORIDE: 100 mmol/L (ref 96–106)
CO2: 26 mmol/L (ref 20–29)
Calcium: 9.5 mg/dL (ref 8.7–10.2)
Creatinine, Ser: 0.78 mg/dL (ref 0.57–1.00)
GFR calc Af Amer: 102 mL/min/{1.73_m2} (ref >59)
GFR calc non Af Amer: 88 mL/min/{1.73_m2} (ref >59)
GLUCOSE: 96 mg/dL (ref 65–99)
Globulin, Total: 2.6 g/dL (ref 1.5–4.5)
POTASSIUM: 4.3 mmol/L (ref 3.5–5.2)
SODIUM: 140 mmol/L (ref 134–144)
TOTAL PROTEIN: 6.9 g/dL (ref 6.0–8.5)

## 2017-08-03 LAB — LIPID PANEL
CHOL/HDL RATIO: 2.6 ratio (ref 0.0–4.4)
Cholesterol, Total: 153 mg/dL (ref 100–199)
HDL: 60 mg/dL (ref >39)
LDL CALC: 82 mg/dL (ref 0–99)
Triglycerides: 55 mg/dL (ref 0–149)
VLDL Cholesterol Cal: 11 mg/dL (ref 5–40)

## 2017-08-03 LAB — VITAMIN B12: Vitamin B-12: 895 pg/mL (ref 232–1245)

## 2017-08-03 LAB — HEMOGLOBIN A1C
Est. average glucose Bld gHb Est-mCnc: 111 mg/dL
Hgb A1c MFr Bld: 5.5 % (ref 4.8–5.6)

## 2017-08-03 LAB — VITAMIN D 25 HYDROXY (VIT D DEFICIENCY, FRACTURES): Vit D, 25-Hydroxy: 46 ng/mL (ref 30.0–100.0)

## 2017-08-03 LAB — TSH: TSH: 0.626 u[IU]/mL (ref 0.450–4.500)

## 2017-08-04 LAB — PAP IG AND HPV HIGH-RISK
HPV, high-risk: NEGATIVE
PAP Smear Comment: 0

## 2017-08-09 ENCOUNTER — Ambulatory Visit
Admission: RE | Admit: 2017-08-09 | Discharge: 2017-08-09 | Disposition: A | Discharge status: Home / Self Care | Payer: Managed Care, Other (non HMO) | Source: Ambulatory Visit | Attending: Family Medicine | Admitting: Family Medicine

## 2017-08-09 DIAGNOSIS — R590 Localized enlarged lymph nodes: Secondary | ICD-10-CM | POA: Insufficient documentation

## 2017-08-10 ENCOUNTER — Other Ambulatory Visit: Payer: Self-pay | Admitting: Family Medicine

## 2017-08-10 DIAGNOSIS — R590 Localized enlarged lymph nodes: Secondary | ICD-10-CM

## 2017-08-10 NOTE — Progress Notes (Signed)
Referred to gen surg

## 2017-08-24 ENCOUNTER — Ambulatory Visit (INDEPENDENT_AMBULATORY_CARE_PROVIDER_SITE_OTHER): Payer: Managed Care, Other (non HMO) | Admitting: Surgery

## 2017-08-24 ENCOUNTER — Encounter: Payer: Self-pay | Admitting: Surgery

## 2017-08-24 VITALS — BP 129/73 | HR 80 | Ht 63.0 in | Wt 186.0 lb

## 2017-08-24 DIAGNOSIS — R590 Localized enlarged lymph nodes: Secondary | ICD-10-CM

## 2017-08-24 NOTE — Patient Instructions (Signed)
Please continue to do self check ups and make sure that you do not get more lumps or that it does not get any bigger.  Please give [place] a call in case you have any questions or concerns.

## 2017-08-24 NOTE — Progress Notes (Signed)
Patient ID: Wendy Li, female   DOB: March 21, 1966, 52 y.o.   MRN: [id number]  HPI Wendy Li is a 52 y.o. female seen in consultation for right inguinal nodes.  She reports that she has been doing significant dieting and exercise and has lost about 40 pounds and over the last few weeks noticed some enlarged lymph nodes on the right inguinal area.  She reports that she is been going through menopause and does have night sweats but no fevers during the day.  She has intentional weight loss for the last few months.   She has been seen in consultation at the request of Dr. Sanda Klein. Ultrasound performed on I have personally reviewed.  There is evidence of normal lymph nodes on the right groin.  No evidence of any masses or pathological lesions. Her CBC and her CMP and is completely normal. He is able to perform more than 6 METS of activity without any shortness of breath or chest pain.  She had a recent colonoscopy that was normal as well as a normal mammogram.  She does have a significant family history significant for leukemia on an aunt, breast cancer cousin. Last pap was nml, she did require LEEP at some point in time.   HPI  Past Medical History:  Diagnosis Date  . Abnormal Pap smear of cervix 1994-2002   ASCUS, colposcopy, LEEP procedure-no abnormals since  . Arthritis    lower back  . Breast cancer screening 12/17/2014  . Cervical radiculopathy at C6   . Elevated blood pressure   . Essential hypertension 03/28/2017  . Hypertension   . 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 Obesity   . Screen for colon cancer 07/24/2016  . Vertigo    3-4 episodes/yr, last one 12/2015  . Vitamin B12 deficiency   . Vitamin D deficiency disease     Past Surgical History:  Procedure Laterality Date  . COLONOSCOPY WITH PROPOFOL N/A 08/28/2016   Procedure: COLONOSCOPY WITH PROPOFOL;  Surgeon: Lucilla Lame, MD;  Location: [facility];  Service: Endoscopy;  Laterality: N/A;  .  DILATION AND CURETTAGE OF UTERUS  1989  . HAND SURGERY  1997   cyst removal  . TONSILLECTOMY  1995    Family History  Problem Relation Age of Onset  . Cancer Mother   . Hyperlipidemia Mother   . Hypertension Mother   . Atrial fibrillation Father   . Heart disease Father        angioplasty  . Hyperlipidemia Father   . Thyroid disease Father   . Arthritis Sister   . Hypertension Maternal Grandmother   . Blindness Maternal Grandmother   . Cancer Paternal Grandfather        testicular  . Cancer Paternal Aunt   . Breast cancer Paternal Aunt     Social History Social History   Tobacco Use  . Smoking status: Former Research scientist (life sciences)  . Smokeless tobacco: Never Used  . Tobacco comment: quit pre-2000  Substance Use Topics  . Alcohol use: No    Comment: holidays  . Drug use: No    Allergies  Allergen Reactions  . Other Shortness Of Breath and Swelling    Food with seeds  . Darvon [Propoxyphene] Nausea And Vomiting  . Erythromycin Hives and Other (See Comments)    GI upset    Current Outpatient Medications  Medication Sig Dispense Refill  . BLACK COHOSH EXTRACT PO Take by mouth.    . Calcium-Vitamin D-Vitamin K (CHEWABLE CALCIUM PO) Take  by mouth daily.    . Cholecalciferol (VITAMIN D) 2000 UNITS CAPS Take 2,000 Units by mouth once a week.    . Flaxseed, Linseed, (FLAX SEED OIL) 1000 MG CAPS Take by mouth.    . fluticasone (FLONASE) 50 MCG/ACT nasal spray Place 2 sprays into both nostrils daily. (Patient taking differently: Place 2 sprays into both nostrils daily as needed. ) 16 g 1  . hydrochlorothiazide (HYDRODIURIL) 12.5 MG tablet Take 12.5 mg by mouth daily.    . indomethacin (INDOCIN) 50 MG capsule Take 1 capsule (50 mg total) by mouth 3 (three) times daily with meals. 21 capsule 0  . Multiple Vitamin (MULTIVITAMIN) tablet Take 1 tablet by mouth daily. melaleuca    . Turmeric 450 MG CAPS Take by mouth.     No current facility-administered medications for this visit.       Review of Systems Full ROS  was asked and was negative except for the information on the HPI  Physical Exam Blood pressure 129/73, pulse 80, height 5\' 3"  (1.6 m), weight 84.4 kg (186 lb). CONSTITUTIONAL: NAD EYES: Pupils are equal, round, and reactive to light, Sclera are non-icteric. EARS, NOSE, MOUTH AND THROAT: The oropharynx is clear. The oral mucosa is pink and moist. Hearing is intact to voice. LYMPH NODES:  Lymph nodes in the neck are normal. RESPIRATORY:  Lungs are clear. There is normal respiratory effort, with equal breath sounds bilaterally, and without pathologic use of accessory muscles. CARDIOVASCULAR: Heart is regular without murmurs, gallops, or rubs. GI: The abdomen is  soft, nontender, and nondistended. There are no palpable masses. There is no hepatosplenomegaly. There are normal bowel sounds in all quadrants. GU: Rectal deferred.   MUSCULOSKELETAL: Normal muscle strength and tone. No cyanosis or edema.   SKIN: Turgor is good and there are no pathologic skin lesions or ulcers. NEUROLOGIC: Motor and sensation is grossly normal. Cranial nerves are grossly intact. PSYCH:  Oriented to person, place and time. Affect is normal. Lymph Nodes: Normal bases on bilateral necks axilla and there is shotty nodes on the right inguinal area that are not pathological.  Data Reviewed  I have personally reviewed the patient's imaging, laboratory findings and medical records.    Assessment/Plan lymphadenopathy on the right inguinal area.  No evidence of disease to suggest cancer or metastatic disease nor lymphoma.  Discussed with the patient in detail about my thought process.  We recommend watchful waiting.  Obviously if there is increasing size of lymphadenopathy or she exhibits B type symptoms we may have to do a biopsy.  Discussed with the patient in detail about the signs and symptoms of alert.  She understands.  Follow-up as needed If this report will be sent to Dr.  Sheppard Evens, MD FACS General Surgeon 08/24/2017, 3:58 PM

## 2017-12-15 ENCOUNTER — Other Ambulatory Visit: Payer: Self-pay | Admitting: Family Medicine

## 2017-12-15 DIAGNOSIS — Z1231 Encounter for screening mammogram for malignant neoplasm of breast: Secondary | ICD-10-CM

## 2018-01-13 ENCOUNTER — Encounter (INDEPENDENT_AMBULATORY_CARE_PROVIDER_SITE_OTHER): Payer: Self-pay

## 2018-01-13 ENCOUNTER — Ambulatory Visit
Admission: RE | Admit: 2018-01-13 | Discharge: 2018-01-13 | Disposition: A | Discharge status: Home / Self Care | Payer: Managed Care, Other (non HMO) | Source: Ambulatory Visit | Attending: Family Medicine | Admitting: Family Medicine

## 2018-01-13 DIAGNOSIS — Z1231 Encounter for screening mammogram for malignant neoplasm of breast: Secondary | ICD-10-CM | POA: Diagnosis present

## 2018-08-01 ENCOUNTER — Encounter: Payer: Managed Care, Other (non HMO) | Admitting: Family Medicine

## 2018-10-17 ENCOUNTER — Encounter: Payer: Managed Care, Other (non HMO) | Admitting: Family Medicine

## 2019-01-09 ENCOUNTER — Other Ambulatory Visit: Payer: Self-pay | Admitting: Family Medicine

## 2019-01-09 DIAGNOSIS — Z1231 Encounter for screening mammogram for malignant neoplasm of breast: Secondary | ICD-10-CM

## 2019-03-27 ENCOUNTER — Ambulatory Visit
Admission: RE | Admit: 2019-03-27 | Discharge: 2019-03-27 | Disposition: A | Discharge status: Home / Self Care | Payer: Managed Care, Other (non HMO) | Source: Ambulatory Visit | Attending: Family Medicine | Admitting: Family Medicine

## 2019-03-27 ENCOUNTER — Other Ambulatory Visit: Payer: Self-pay

## 2019-03-27 DIAGNOSIS — Z1231 Encounter for screening mammogram for malignant neoplasm of breast: Secondary | ICD-10-CM | POA: Insufficient documentation

## 2020-03-28 ENCOUNTER — Other Ambulatory Visit: Payer: Self-pay | Admitting: Family Medicine

## 2020-03-28 DIAGNOSIS — Z1231 Encounter for screening mammogram for malignant neoplasm of breast: Secondary | ICD-10-CM

## 2020-04-11 ENCOUNTER — Ambulatory Visit: Payer: Managed Care, Other (non HMO)

## 2020-04-23 ENCOUNTER — Encounter (INDEPENDENT_AMBULATORY_CARE_PROVIDER_SITE_OTHER): Payer: Self-pay

## 2020-04-23 ENCOUNTER — Other Ambulatory Visit: Payer: Self-pay

## 2020-04-23 ENCOUNTER — Ambulatory Visit
Admission: RE | Admit: 2020-04-23 | Discharge: 2020-04-23 | Disposition: A | Discharge status: Home / Self Care | Payer: Managed Care, Other (non HMO) | Source: Ambulatory Visit | Attending: Family Medicine | Admitting: Family Medicine

## 2020-04-23 DIAGNOSIS — Z1231 Encounter for screening mammogram for malignant neoplasm of breast: Secondary | ICD-10-CM | POA: Diagnosis not present

## 2020-10-03 ENCOUNTER — Encounter: Payer: Self-pay | Admitting: Family Medicine

## 2020-11-15 ENCOUNTER — Ambulatory Visit: Payer: Managed Care, Other (non HMO) | Admitting: Family Medicine

## 2020-11-29 NOTE — Progress Notes (Signed)
BP 128/82   Pulse 78   Temp 98.1 F (36.7 C) (Oral)   Ht 5' 2.6" (1.59 m)   Wt 210 lb 3.2 oz (95.3 kg)   SpO2 98%   BMI 37.71 kg/m    Subjective:    Patient ID: Wendy Li, female    DOB: 1965-04-06, 55 y.o.   MRN: [id number]  HPI: Wendy Li is a 55 y.o. female  Chief Complaint  Patient presents with   New Patient (Initial Visit)   Patient presents to clinic to establish care with new PCP. Patient states she had HTN in the past (borderline).  She states she was on HCTZ and then enrolled in a program for weight loss and lost 50lbs.  States she came off of the medication with the weight loss.  Patient states she has a Vit D and B12 def in the past.    Patient states she has pain in her back and hips and takes Meloxicam occasionally (mostly during the winter months) to help control her pain.   Abnormal PAP- Patient states she has had several abnormal PAP's in the past.  She has cryo and a colposcopy. And since then her PAPs have been done.    Denies HA, CP, SOB, dizziness, palpitations, visual changes, and lower extremity swelling.  Active Ambulatory Problems    Diagnosis Date Noted   Breast cancer screening 12/17/2014   Obesity    Cervical radiculopathy at C6    Vitamin D deficiency disease    Vitamin B12 deficiency    Abnormal Pap smear of cervix    Preventative health care 07/22/2015   Acute otitis externa of right ear 01/13/2016   Nasal turbinate hypertrophy 01/23/2016   Screen for colon cancer 07/24/2016   Special screening for malignant neoplasms, colon    Screening for cervical cancer 07/30/2017   Essential hypertension 03/28/2017   Primary osteoarthritis involving multiple joints 03/28/2017   Resolved Ambulatory Problems    Diagnosis Date Noted   Elevated blood pressure    Encounter for screening for HIV 07/19/2015   Past Medical History:  Diagnosis Date   Arthritis    Hypertension    Vertigo    Past Surgical History:   Procedure Laterality Date   COLONOSCOPY WITH PROPOFOL N/A 08/28/2016   Procedure: COLONOSCOPY WITH PROPOFOL;  Surgeon: Lucilla Lame, MD;  Location: [place];  Service: Endoscopy;  Laterality: N/A;   DILATION AND CURETTAGE OF UTERUS  03/24/1987   HAND SURGERY  03/24/1995   cyst removal   TONSILLECTOMY  03/23/1993   WISDOM TOOTH EXTRACTION     Family History  Problem Relation Age of Onset   Hyperlipidemia Mother    Hypertension Mother    Basal cell carcinoma Mother    Atrial fibrillation Father    Heart disease Father        angioplasty   Hyperlipidemia Father    Thyroid disease Father    Arthritis Sister    Vitamin D deficiency Daughter    Breast cancer Maternal Aunt 54   Cancer Paternal Aunt    Breast cancer Paternal Aunt    Hypertension Maternal Grandmother    Blindness Maternal Grandmother    Dementia Maternal Grandfather    Cancer Paternal Grandfather        testicular      Review of Systems  Eyes:  Negative for visual disturbance.  Respiratory:  Negative for cough, chest tightness and shortness of breath.   Cardiovascular:  Negative for chest pain,  palpitations and leg swelling.  Neurological:  Negative for dizziness and headaches.   Per HPI unless specifically indicated above     Objective:    BP 128/82   Pulse 78   Temp 98.1 F (36.7 C) (Oral)   Ht 5' 2.6" (1.59 m)   Wt 210 lb 3.2 oz (95.3 kg)   SpO2 98%   BMI 37.71 kg/m   Wt Readings from Last 3 Encounters:  12/02/20 210 lb 3.2 oz (95.3 kg)  08/24/17 186 lb (84.4 kg)  07/30/17 182 lb 12.8 oz (82.9 kg)    Physical Exam Vitals and nursing note reviewed.  Constitutional:      General: She is not in acute distress.    Appearance: Normal appearance. She is normal weight. She is not ill-appearing, toxic-appearing or diaphoretic.  HENT:     Head: Normocephalic.     Right Ear: External ear normal.     Left Ear: External ear normal.     Nose: Nose normal.     Mouth/Throat:     Mouth:  Mucous membranes are moist.     Pharynx: Oropharynx is clear.  Eyes:     General:        Right eye: No discharge.        Left eye: No discharge.     Extraocular Movements: Extraocular movements intact.     Conjunctiva/sclera: Conjunctivae normal.     Pupils: Pupils are equal, round, and reactive to light.  Cardiovascular:     Rate and Rhythm: Normal rate and regular rhythm.     Heart sounds: No murmur heard. Pulmonary:     Effort: Pulmonary effort is normal. No respiratory distress.     Breath sounds: Normal breath sounds. No wheezing or rales.  Musculoskeletal:     Cervical back: Normal range of motion and neck supple.  Skin:    General: Skin is warm and dry.     Capillary Refill: Capillary refill takes less than 2 seconds.  Neurological:     General: No focal deficit present.     Mental Status: She is alert and oriented to person, place, and time. Mental status is at baseline.  Psychiatric:        Mood and Affect: Mood normal.        Behavior: Behavior normal.        Thought Content: Thought content normal.        Judgment: Judgment normal.    Results for orders placed or performed in visit on 07/30/17  Comprehensive metabolic panel  Result Value Ref Range   Glucose 96 65 - 99 mg/dL   BUN 24 6 - 24 mg/dL   Creatinine, Ser 0.78 0.57 - 1.00 mg/dL   GFR calc non Af Amer 88 >59 mL/min/1.73   GFR calc Af Amer 102 >59 mL/min/1.73   BUN/Creatinine Ratio 31 (H) 9 - 23   Sodium 140 134 - 144 mmol/L   Potassium 4.3 3.5 - 5.2 mmol/L   Chloride 100 96 - 106 mmol/L   CO2 26 20 - 29 mmol/L   Calcium 9.5 8.7 - 10.2 mg/dL   Total Protein 6.9 6.0 - 8.5 g/dL   Albumin 4.3 3.5 - 5.5 g/dL   Globulin, Total 2.6 1.5 - 4.5 g/dL   Albumin/Globulin Ratio 1.7 1.2 - 2.2   Bilirubin Total <0.2 0.0 - 1.2 mg/dL   Alkaline Phosphatase 68 39 - 117 IU/L   AST 13 0 - 40 IU/L   ALT 17 0 - 32  IU/L  CBC with Differential/Platelet  Result Value Ref Range   WBC 6.9 3.4 - 10.8 x10E3/uL   RBC 5.01  3.77 - 5.28 x10E6/uL   Hemoglobin 14.7 11.1 - 15.9 g/dL   Hematocrit 43.6 34.0 - 46.6 %   MCV 87 79 - 97 fL   MCH 29.3 26.6 - 33.0 pg   MCHC 33.7 31.5 - 35.7 g/dL   RDW 12.9 12.3 - 15.4 %   Platelets 309 150 - 379 x10E3/uL   Neutrophils 59 Not Estab. %   Lymphs 33 Not Estab. %   Monocytes 7 Not Estab. %   Eos 1 Not Estab. %   Basos 0 Not Estab. %   Neutrophils Absolute 4.1 1.4 - 7.0 x10E3/uL   Lymphocytes Absolute 2.3 0.7 - 3.1 x10E3/uL   Monocytes Absolute 0.5 0.1 - 0.9 x10E3/uL   EOS (ABSOLUTE) 0.1 0.0 - 0.4 x10E3/uL   Basophils Absolute 0.0 0.0 - 0.2 x10E3/uL   Immature Granulocytes 0 Not Estab. %   Immature Grans (Abs) 0.0 0.0 - 0.1 x10E3/uL  Hemoglobin A1c  Result Value Ref Range   Hgb A1c MFr Bld 5.5 4.8 - 5.6 %   Est. average glucose Bld gHb Est-mCnc 111 mg/dL  Lipid panel  Result Value Ref Range   Cholesterol, Total 153 100 - 199 mg/dL   Triglycerides 55 0 - 149 mg/dL   HDL 60 >39 mg/dL   VLDL Cholesterol Cal 11 5 - 40 mg/dL   LDL Calculated 82 0 - 99 mg/dL   Chol/HDL Ratio 2.6 0.0 - 4.4 ratio  TSH  Result Value Ref Range   TSH 0.626 0.450 - 4.500 uIU/mL  Vitamin B12  Result Value Ref Range   Vitamin B-12 895 232 - 1,245 pg/mL  VITAMIN D 25 Hydroxy (Vit-D Deficiency, Fractures)  Result Value Ref Range   Vit D, 25-Hydroxy 46.0 30.0 - 100.0 ng/mL  Pap IG and HPV (high risk) DNA detection  Result Value Ref Range   DIAGNOSIS: Comment    Specimen adequacy: Comment    Clinician Provided ICD10 Comment    Performed by: Comment    QC reviewed by: Comment    PAP Smear Comment .    Note: Comment    Test Methodology Comment    HPV, high-risk Negative Negative      Assessment & Plan:   Problem List Items Addressed This Visit       Cardiovascular and Mediastinum   Essential hypertension - Primary    Chronic. Well controlled without medication.  Continue with DASH diet.  Will continue to monitor at future visits.  Will draw labs at next visit.       Relevant  Medications   aspirin EC 81 MG tablet     Other   Obesity    Chronic. Recommend a healthy lifestyle through diet and exercise.       Vitamin D deficiency disease    Chronic. Continue with OTC supplements. Will draw labs at next visit.       Vitamin B12 deficiency    Chronic. Continue with OTC supplements. Will draw labs at next visit.       Abnormal Pap smear of cervix    Colposcopy done in 2016. Patient get's yearly PAPs. Will get PAP done at next visit.       Other Visit Diagnoses     Encounter to establish care            Follow up plan: Return in about 1 month (  around 01/01/2021) for Physical and Fasting labs, PAP.   A total of 30 minutes were spent on this encounter today.  When total time is documented, this includes both the face-to-face and non-face-to-face time personally spent before, during and after the visit on the date of the encounter reviewing patient's medical history, medications, and previous imaging.

## 2020-12-02 ENCOUNTER — Encounter: Payer: Self-pay | Admitting: Nurse Practitioner

## 2020-12-02 ENCOUNTER — Other Ambulatory Visit: Payer: Self-pay

## 2020-12-02 ENCOUNTER — Ambulatory Visit: Payer: Managed Care, Other (non HMO) | Admitting: Nurse Practitioner

## 2020-12-02 VITALS — BP 128/82 | HR 78 | Temp 98.1°F | Ht 62.6 in | Wt 210.2 lb

## 2020-12-02 DIAGNOSIS — E559 Vitamin D deficiency, unspecified: Secondary | ICD-10-CM

## 2020-12-02 DIAGNOSIS — E538 Deficiency of other specified B group vitamins: Secondary | ICD-10-CM | POA: Diagnosis not present

## 2020-12-02 DIAGNOSIS — R87610 Atypical squamous cells of undetermined significance on cytologic smear of cervix (ASC-US): Secondary | ICD-10-CM

## 2020-12-02 DIAGNOSIS — Z6836 Body mass index (BMI) 36.0-36.9, adult: Secondary | ICD-10-CM

## 2020-12-02 DIAGNOSIS — E6609 Other obesity due to excess calories: Secondary | ICD-10-CM | POA: Diagnosis not present

## 2020-12-02 DIAGNOSIS — Z7689 Persons encountering health services in other specified circumstances: Secondary | ICD-10-CM

## 2020-12-02 DIAGNOSIS — E66812 Obesity, class 2: Secondary | ICD-10-CM

## 2020-12-02 DIAGNOSIS — I10 Essential (primary) hypertension: Secondary | ICD-10-CM | POA: Diagnosis not present

## 2020-12-02 NOTE — Assessment & Plan Note (Signed)
Chronic. Continue with OTC supplements. Will draw labs at next visit.

## 2020-12-02 NOTE — Assessment & Plan Note (Signed)
Chronic. Well controlled without medication.  Continue with DASH diet.  Will continue to monitor at future visits.  Will draw labs at next visit.

## 2020-12-02 NOTE — Assessment & Plan Note (Signed)
Chronic. Recommend a healthy lifestyle through diet and exercise.

## 2020-12-02 NOTE — Assessment & Plan Note (Signed)
Colposcopy done in 2016. Patient get's yearly PAPs. Will get PAP done at next visit.

## 2020-12-30 NOTE — Progress Notes (Signed)
BP 126/81   Pulse 76   Temp 98.5 F (36.9 C) (Oral)   Ht 5' 3.2" (1.605 m)   Wt 210 lb 9.6 oz (95.5 kg)   SpO2 98%   BMI 37.07 kg/m    Subjective:    Patient ID: Wendy Li, female    DOB: 19-Dec-1965, 55 y.o.   MRN: [id number]  HPI: Wendy Li is a 55 y.o. female presenting on 12/31/2020 for comprehensive medical examination. Current medical complaints include:none  She currently lives with: Menopausal Symptoms: no  HYPERTENSION Hypertension status: controlled  Satisfied with current treatment? no Duration of hypertension: years BP monitoring frequency:  not checking BP range:  BP medication side effects:  no Medication compliance: excellent compliance Previous BP meds:none Aspirin: no Recurrent headaches: no Visual changes: no Palpitations: no Dyspnea: no Chest pain: no Lower extremity edema: no Dizzy/lightheaded: no  Depression Screen done today and results listed below:  Depression screen [facility] 2/9 12/31/2020 12/02/2020 07/30/2017 03/30/2017 07/24/2016  Decreased Interest 0 0 0 0 0  Down, Depressed, Hopeless 0 0 0 0 0  PHQ - 2 Score 0 0 0 0 0    The patient has a history of falls. I did complete a risk assessment for falls. A plan of care for falls was documented.   Past Medical History:  Past Medical History:  Diagnosis Date   Abnormal Pap smear of cervix 1994-2002   ASCUS, colposcopy, LEEP procedure-no abnormals since   Arthritis    lower back   Breast cancer screening 12/17/2014   Cervical radiculopathy at C6    Elevated blood pressure    Essential hypertension 03/28/2017   Hypertension    Nasal turbinate hypertrophy 01/23/2016   Obesity    Screen for colon cancer 07/24/2016   Vertigo    3-4 episodes/yr, last one 12/2015   Vitamin B12 deficiency    Vitamin D deficiency disease     Surgical History:  Past Surgical History:  Procedure Laterality Date   COLONOSCOPY WITH PROPOFOL N/A 08/28/2016   Procedure: COLONOSCOPY WITH  PROPOFOL;  Surgeon: Lucilla Lame, MD;  Location: [place];  Service: Endoscopy;  Laterality: N/A;   DILATION AND CURETTAGE OF UTERUS  03/24/1987   HAND SURGERY  03/24/1995   cyst removal   TONSILLECTOMY  03/23/1993   WISDOM TOOTH EXTRACTION      Medications:  Current Outpatient Medications on File Prior to Visit  Medication Sig   aspirin EC 81 MG tablet Take 81 mg by mouth daily. Swallow whole.   Calcium-Vitamin D-Vitamin K (CHEWABLE CALCIUM PO) Take 1 each by mouth daily.   Cholecalciferol (VITAMIN D) 2000 UNITS CAPS Take 2,000 Units by mouth daily.   Flaxseed, Linseed, (FLAX SEED OIL) 1000 MG CAPS Take by mouth.   meloxicam (MOBIC) 7.5 MG tablet Take 7.5 mg by mouth every other day.   Moringa Oleifera 500 MG CAPS Take 1 capsule by mouth daily.   Multiple Vitamin (MULTIVITAMIN) tablet Take 1 tablet by mouth daily. melaleuca   Probiotic Product (PROBIOTIC PO) Take 1 tablet by mouth daily.   Turmeric 450 MG CAPS Take 450 mg by mouth daily.   No current facility-administered medications on file prior to visit.    Allergies:  Allergies  Allergen Reactions   Other Shortness Of Breath and Swelling    Food with seeds   Darvon [Propoxyphene] Nausea And Vomiting   Erythromycin Hives and Other (See Comments)    GI upset    Social History:  Social  History   Socioeconomic History   Marital status: Married    Spouse name: Not on file   Number of children: Not on file   Years of education: Not on file   Highest education level: Not on file  Occupational History   Not on file  Tobacco Use   Smoking status: Former   Smokeless tobacco: Never   Tobacco comments:    quit pre-2000  Vaping Use   Vaping Use: Never used  Substance and Sexual Activity   Alcohol use: No    Comment: holidays   Drug use: No   Sexual activity: Yes    Birth control/protection: Post-menopausal, Diaphragm  Other Topics Concern   Not on file  Social History Narrative   Not on file   Social  Determinants of Health   Financial Resource Strain: Not on file  Food Insecurity: Not on file  Transportation Needs: Not on file  Physical Activity: Not on file  Stress: Not on file  Social Connections: Not on file  Intimate Partner Violence: Not on file   Social History   Tobacco Use  Smoking Status Former  Smokeless Tobacco Never  Tobacco Comments   quit pre-2000   Social History   Substance and Sexual Activity  Alcohol Use No   Comment: holidays    Family History:  Family History  Problem Relation Age of Onset   Hyperlipidemia Mother    Hypertension Mother    Basal cell carcinoma Mother    Atrial fibrillation Father    Heart disease Father        angioplasty   Hyperlipidemia Father    Thyroid disease Father    Arthritis Sister    Vitamin D deficiency Daughter    Breast cancer Maternal Aunt 75   Cancer Paternal Aunt    Breast cancer Paternal Aunt    Hypertension Maternal Grandmother    Blindness Maternal Grandmother    Dementia Maternal Grandfather    Cancer Paternal Grandfather        testicular    Past medical history, surgical history, medications, allergies, family history and social history reviewed with patient today and changes made to appropriate areas of the chart.   Review of Systems  Eyes:  Negative for blurred vision and double vision.  Respiratory:  Negative for shortness of breath.   Cardiovascular:  Negative for chest pain, palpitations and leg swelling.  Neurological:  Negative for dizziness and headaches.  All other ROS negative except what is listed above and in the HPI.      Objective:    BP 126/81   Pulse 76   Temp 98.5 F (36.9 C) (Oral)   Ht 5' 3.2" (1.605 m)   Wt 210 lb 9.6 oz (95.5 kg)   SpO2 98%   BMI 37.07 kg/m   Wt Readings from Last 3 Encounters:  12/31/20 210 lb 9.6 oz (95.5 kg)  12/02/20 210 lb 3.2 oz (95.3 kg)  08/24/17 186 lb (84.4 kg)    Physical Exam Vitals and nursing note reviewed. Exam conducted with a  chaperone present Yvonna Alanis, [place]).  Constitutional:      General: She is awake. She is not in acute distress.    Appearance: She is well-developed. She is obese. She is not ill-appearing.  HENT:     Head: Normocephalic and atraumatic.     Right Ear: Hearing, tympanic membrane, ear canal and external ear normal. No drainage.     Left Ear: Hearing, tympanic membrane, ear canal and  external ear normal. No drainage.     Nose: Nose normal.     Right Sinus: No maxillary sinus tenderness or frontal sinus tenderness.     Left Sinus: No maxillary sinus tenderness or frontal sinus tenderness.     Mouth/Throat:     Mouth: Mucous membranes are moist.     Pharynx: Oropharynx is clear. Uvula midline. No pharyngeal swelling, oropharyngeal exudate or posterior oropharyngeal erythema.  Eyes:     General: Lids are normal.        Right eye: No discharge.        Left eye: No discharge.     Extraocular Movements: Extraocular movements intact.     Conjunctiva/sclera: Conjunctivae normal.     Pupils: Pupils are equal, round, and reactive to light.     Visual Fields: Right eye visual fields normal and left eye visual fields normal.  Neck:     Thyroid: No thyromegaly.     Vascular: No carotid bruit.     Trachea: Trachea normal.  Cardiovascular:     Rate and Rhythm: Normal rate and regular rhythm.     Heart sounds: Normal heart sounds. No murmur heard.   No gallop.  Pulmonary:     Effort: Pulmonary effort is normal. No accessory muscle usage or respiratory distress.     Breath sounds: Normal breath sounds.  Chest:  Breasts:    Right: Normal.     Left: Normal.  Abdominal:     General: Bowel sounds are normal.     Palpations: Abdomen is soft. There is no hepatomegaly or splenomegaly.     Tenderness: There is no abdominal tenderness.  Genitourinary:    Vagina: Normal.     Cervix: Normal.     Adnexa: Right adnexa normal and left adnexa normal.  Musculoskeletal:        General: Normal range of  motion.     Cervical back: Normal range of motion and neck supple.     Right lower leg: No edema.     Left lower leg: No edema.  Lymphadenopathy:     Head:     Right side of head: No submental, submandibular, tonsillar, preauricular or posterior auricular adenopathy.     Left side of head: No submental, submandibular, tonsillar, preauricular or posterior auricular adenopathy.     Cervical: No cervical adenopathy.     Upper Body:     Right upper body: No supraclavicular, axillary or pectoral adenopathy.     Left upper body: No supraclavicular, axillary or pectoral adenopathy.  Skin:    General: Skin is warm and dry.     Capillary Refill: Capillary refill takes less than 2 seconds.     Findings: No rash.  Neurological:     Mental Status: She is alert and oriented to person, place, and time.     Cranial Nerves: Cranial nerves are intact.     Gait: Gait is intact.     Deep Tendon Reflexes: Reflexes are normal and symmetric.     Reflex Scores:      Brachioradialis reflexes are 2+ on the right side and 2+ on the left side.      Patellar reflexes are 2+ on the right side and 2+ on the left side. Psychiatric:        Attention and Perception: Attention normal.        Mood and Affect: Mood normal.        Speech: Speech normal.        Behavior:  Behavior normal. Behavior is cooperative.        Thought Content: Thought content normal.        Judgment: Judgment normal.    Results for orders placed or performed in visit on 07/30/17  Comprehensive metabolic panel  Result Value Ref Range   Glucose 96 65 - 99 mg/dL   BUN 24 6 - 24 mg/dL   Creatinine, Ser 0.78 0.57 - 1.00 mg/dL   GFR calc non Af Amer 88 >59 mL/min/1.73   GFR calc Af Amer 102 >59 mL/min/1.73   BUN/Creatinine Ratio 31 (H) 9 - 23   Sodium 140 134 - 144 mmol/L   Potassium 4.3 3.5 - 5.2 mmol/L   Chloride 100 96 - 106 mmol/L   CO2 26 20 - 29 mmol/L   Calcium 9.5 8.7 - 10.2 mg/dL   Total Protein 6.9 6.0 - 8.5 g/dL   Albumin 4.3  3.5 - 5.5 g/dL   Globulin, Total 2.6 1.5 - 4.5 g/dL   Albumin/Globulin Ratio 1.7 1.2 - 2.2   Bilirubin Total <0.2 0.0 - 1.2 mg/dL   Alkaline Phosphatase 68 39 - 117 IU/L   AST 13 0 - 40 IU/L   ALT 17 0 - 32 IU/L  CBC with Differential/Platelet  Result Value Ref Range   WBC 6.9 3.4 - 10.8 x10E3/uL   RBC 5.01 3.77 - 5.28 x10E6/uL   Hemoglobin 14.7 11.1 - 15.9 g/dL   Hematocrit 43.6 34.0 - 46.6 %   MCV 87 79 - 97 fL   MCH 29.3 26.6 - 33.0 pg   MCHC 33.7 31.5 - 35.7 g/dL   RDW 12.9 12.3 - 15.4 %   Platelets 309 150 - 379 x10E3/uL   Neutrophils 59 Not Estab. %   Lymphs 33 Not Estab. %   Monocytes 7 Not Estab. %   Eos 1 Not Estab. %   Basos 0 Not Estab. %   Neutrophils Absolute 4.1 1.4 - 7.0 x10E3/uL   Lymphocytes Absolute 2.3 0.7 - 3.1 x10E3/uL   Monocytes Absolute 0.5 0.1 - 0.9 x10E3/uL   EOS (ABSOLUTE) 0.1 0.0 - 0.4 x10E3/uL   Basophils Absolute 0.0 0.0 - 0.2 x10E3/uL   Immature Granulocytes 0 Not Estab. %   Immature Grans (Abs) 0.0 0.0 - 0.1 x10E3/uL  Hemoglobin A1c  Result Value Ref Range   Hgb A1c MFr Bld 5.5 4.8 - 5.6 %   Est. average glucose Bld gHb Est-mCnc 111 mg/dL  Lipid panel  Result Value Ref Range   Cholesterol, Total 153 100 - 199 mg/dL   Triglycerides 55 0 - 149 mg/dL   HDL 60 >39 mg/dL   VLDL Cholesterol Cal 11 5 - 40 mg/dL   LDL Calculated 82 0 - 99 mg/dL   Chol/HDL Ratio 2.6 0.0 - 4.4 ratio  TSH  Result Value Ref Range   TSH 0.626 0.450 - 4.500 uIU/mL  Vitamin B12  Result Value Ref Range   Vitamin B-12 895 232 - 1,245 pg/mL  VITAMIN D 25 Hydroxy (Vit-D Deficiency, Fractures)  Result Value Ref Range   Vit D, 25-Hydroxy 46.0 30.0 - 100.0 ng/mL  Pap IG and HPV (high risk) DNA detection  Result Value Ref Range   DIAGNOSIS: Comment    Specimen adequacy: Comment    Clinician Provided ICD10 Comment    Performed by: Comment    QC reviewed by: Comment    PAP Smear Comment .    Note: Comment    Test Methodology Comment    HPV, high-risk  Negative  Negative      Assessment & Plan:   Problem List Items Addressed This Visit       Cardiovascular and Mediastinum   Essential hypertension    Chronic.  Controlled with diet and exercise. Labs ordered today.  Return to clinic in 6 months for reevaluation.  Call sooner if concerns arise.          Other   Vitamin D deficiency disease    Labs ordered today. Will make recommendations based on lab work.       Relevant Orders   Vitamin D (25 hydroxy)   Vitamin B12 deficiency    Labs ordered today. Will make recommendations based on lab work.       Relevant Orders   B12   Screening for cervical cancer    PAP obtained today. PAP obtained in normal fashion. Patient tolerated obtaining of specimen without complication.  Escorted by Yvonna Alanis, [place].      Relevant Orders   Cytology - PAP   Other Visit Diagnoses     Annual physical exam    -  Primary   Health mainteance reviewed during visit today. PAP done. Labs ordered.  [place] flu shot at work.    Relevant Orders   CBC with Differential/Platelet   Comprehensive metabolic panel   Lipid panel   TSH   Urinalysis, Routine w reflex microscopic   Encounter for hepatitis C screening test for low risk patient       Relevant Orders   Hepatitis C Antibody        Follow up plan: Return in about 6 months (around 07/01/2021) for BP Check.   LABORATORY TESTING:  - Pap smear: pap done  IMMUNIZATIONS:   - Tdap: Tetanus vaccination status reviewed: Done at [facility]. - Influenza:  Will get it at work. - Pneumovax: Not applicable - Prevnar: Not applicable - HPV: Not applicable - Zostavax vaccine:  Discussed at visit today.  SCREENING: -Mammogram: Up to date  - Colonoscopy: Up to date  - Bone Density: Not applicable  -Hearing Test: Not applicable  -Spirometry: Not applicable   PATIENT COUNSELING:   Advised to take 1 mg of folate supplement per day if capable of pregnancy.   Sexuality: Discussed  sexually transmitted diseases, partner selection, use of condoms, avoidance of unintended pregnancy  and contraceptive alternatives.   Advised to avoid cigarette smoking.  I discussed with the patient that most people either abstain from alcohol or drink within safe limits (<=14/week and <=4 drinks/occasion for males, <=7/weeks and <= 3 drinks/occasion for females) and that the risk for alcohol disorders and other health effects rises proportionally with the number of drinks per week and how often a drinker exceeds daily limits.  Discussed cessation/primary prevention of drug use and availability of treatment for abuse.   Diet: Encouraged to adjust caloric intake to maintain  or achieve ideal body weight, to reduce intake of dietary saturated fat and total fat, to limit sodium intake by avoiding high sodium foods and not adding table salt, and to maintain adequate dietary potassium and calcium preferably from fresh fruits, vegetables, and low-fat dairy products.    stressed the importance of regular exercise  Injury prevention: Discussed safety belts, safety helmets, smoke detector, smoking near bedding or upholstery.   Dental health: Discussed importance of regular tooth brushing, flossing, and dental visits.    NEXT PREVENTATIVE PHYSICAL DUE IN 1 YEAR. Return in about 6 months (around 07/01/2021) for BP Check.

## 2020-12-31 ENCOUNTER — Ambulatory Visit (INDEPENDENT_AMBULATORY_CARE_PROVIDER_SITE_OTHER): Payer: Managed Care, Other (non HMO) | Admitting: Nurse Practitioner

## 2020-12-31 ENCOUNTER — Other Ambulatory Visit (HOSPITAL_COMMUNITY)
Admission: RE | Admit: 2020-12-31 | Discharge: 2020-12-31 | Disposition: A | Discharge status: Home / Self Care | Payer: Managed Care, Other (non HMO) | Source: Ambulatory Visit | Attending: Nurse Practitioner | Admitting: Nurse Practitioner

## 2020-12-31 ENCOUNTER — Other Ambulatory Visit: Payer: Self-pay

## 2020-12-31 ENCOUNTER — Encounter: Payer: Self-pay | Admitting: Nurse Practitioner

## 2020-12-31 VITALS — BP 126/81 | HR 76 | Temp 98.5°F | Ht 63.2 in | Wt 210.6 lb

## 2020-12-31 DIAGNOSIS — Z0000 Encounter for general adult medical examination without abnormal findings: Secondary | ICD-10-CM | POA: Diagnosis not present

## 2020-12-31 DIAGNOSIS — Z124 Encounter for screening for malignant neoplasm of cervix: Secondary | ICD-10-CM | POA: Insufficient documentation

## 2020-12-31 DIAGNOSIS — E559 Vitamin D deficiency, unspecified: Secondary | ICD-10-CM | POA: Diagnosis not present

## 2020-12-31 DIAGNOSIS — I10 Essential (primary) hypertension: Secondary | ICD-10-CM

## 2020-12-31 DIAGNOSIS — Z1159 Encounter for screening for other viral diseases: Secondary | ICD-10-CM

## 2020-12-31 DIAGNOSIS — E538 Deficiency of other specified B group vitamins: Secondary | ICD-10-CM

## 2020-12-31 LAB — URINALYSIS, ROUTINE W REFLEX MICROSCOPIC
Bilirubin, UA: NEGATIVE
Glucose, UA: NEGATIVE
Ketones, UA: NEGATIVE
Leukocytes,UA: NEGATIVE
Nitrite, UA: NEGATIVE
Protein,UA: NEGATIVE
RBC, UA: NEGATIVE
Specific Gravity, UA: 1.025 (ref 1.005–1.030)
Urobilinogen, Ur: 0.2 mg/dL (ref 0.2–1.0)
pH, UA: 5 (ref 5.0–7.5)

## 2020-12-31 NOTE — Progress Notes (Signed)
Hi Wendy Li. Your urine from today looks good.  I will send you another message once the rest of your lab work comes back.

## 2020-12-31 NOTE — Assessment & Plan Note (Signed)
Labs ordered today. Will make recommendations based on lab work.

## 2020-12-31 NOTE — Assessment & Plan Note (Signed)
PAP obtained today. PAP obtained in normal fashion. Patient tolerated obtaining of specimen without complication.  Escorted by Yvonna Alanis, [place].

## 2020-12-31 NOTE — Assessment & Plan Note (Signed)
Chronic.  Controlled with diet and exercise. Labs ordered today.  Return to clinic in 6 months for reevaluation.  Call sooner if concerns arise.

## 2021-01-01 LAB — COMPREHENSIVE METABOLIC PANEL
ALT: 17 IU/L (ref 0–32)
AST: 20 IU/L (ref 0–40)
Albumin/Globulin Ratio: 1.9 (ref 1.2–2.2)
Albumin: 4.7 g/dL (ref 3.8–4.9)
Alkaline Phosphatase: 83 IU/L (ref 44–121)
BUN/Creatinine Ratio: 20 (ref 9–23)
BUN: 15 mg/dL (ref 6–24)
Bilirubin Total: 0.3 mg/dL (ref 0.0–1.2)
CO2: 25 mmol/L (ref 20–29)
Calcium: 9.5 mg/dL (ref 8.7–10.2)
Chloride: 101 mmol/L (ref 96–106)
Creatinine, Ser: 0.76 mg/dL (ref 0.57–1.00)
Globulin, Total: 2.5 g/dL (ref 1.5–4.5)
Glucose: 83 mg/dL (ref 70–99)
Potassium: 4.1 mmol/L (ref 3.5–5.2)
Sodium: 143 mmol/L (ref 134–144)
Total Protein: 7.2 g/dL (ref 6.0–8.5)
eGFR: 92 mL/min/{1.73_m2} (ref >59)

## 2021-01-01 LAB — CBC WITH DIFFERENTIAL/PLATELET
Basophils Absolute: 0 10*3/uL (ref 0.0–0.2)
Basos: 0 %
EOS (ABSOLUTE): 0.1 10*3/uL (ref 0.0–0.4)
Eos: 1 %
Hematocrit: 44 % (ref 34.0–46.6)
Hemoglobin: 14.3 g/dL (ref 11.1–15.9)
Immature Grans (Abs): 0 10*3/uL (ref 0.0–0.1)
Immature Granulocytes: 0 %
Lymphocytes Absolute: 2.5 10*3/uL (ref 0.7–3.1)
Lymphs: 34 %
MCH: 27.8 pg (ref 26.6–33.0)
MCHC: 32.5 g/dL (ref 31.5–35.7)
MCV: 86 fL (ref 79–97)
Monocytes Absolute: 0.6 10*3/uL (ref 0.1–0.9)
Monocytes: 8 %
Neutrophils Absolute: 4 10*3/uL (ref 1.4–7.0)
Neutrophils: 57 %
Platelets: 329 10*3/uL (ref 150–450)
RBC: 5.14 x10E6/uL (ref 3.77–5.28)
RDW: 13.1 % (ref 11.7–15.4)
WBC: 7.2 10*3/uL (ref 3.4–10.8)

## 2021-01-01 LAB — LIPID PANEL
Chol/HDL Ratio: 2.5 ratio (ref 0.0–4.4)
Cholesterol, Total: 174 mg/dL (ref 100–199)
HDL: 70 mg/dL (ref >39)
LDL Chol Calc (NIH): 87 mg/dL (ref 0–99)
Triglycerides: 92 mg/dL (ref 0–149)
VLDL Cholesterol Cal: 17 mg/dL (ref 5–40)

## 2021-01-01 LAB — VITAMIN B12: Vitamin B-12: 712 pg/mL (ref 232–1245)

## 2021-01-01 LAB — TSH: TSH: 1.12 u[IU]/mL (ref 0.450–4.500)

## 2021-01-01 LAB — VITAMIN D 25 HYDROXY (VIT D DEFICIENCY, FRACTURES): Vit D, 25-Hydroxy: 72.1 ng/mL (ref 30.0–100.0)

## 2021-01-01 LAB — HEPATITIS C ANTIBODY: Hep C Virus Ab: <0.1 s/co ratio (ref 0.0–0.9)

## 2021-01-01 NOTE — Progress Notes (Signed)
Hi Wendy Li.  It was good to see you yesterday. It was nice to see you yesterday.  Your lab work looks good.  No concerns at this time. Continue with your current medication regimen.  Follow up as discussed.  Please let me know if you have any questions.

## 2021-01-02 LAB — CYTOLOGY - PAP: Diagnosis: NEGATIVE

## 2021-01-02 NOTE — Progress Notes (Signed)
Hi Janna.  Your PAP was normal.  We will repeat it in 5 years.

## 2021-03-10 ENCOUNTER — Encounter: Payer: Self-pay | Admitting: Nurse Practitioner

## 2021-03-10 NOTE — Progress Notes (Signed)
BP 121/78    Pulse 77    Temp 98.8 F (37.1 C) (Oral)    Ht 5' 2.3" (1.582 m)    Wt 213 lb 12.8 oz (97 kg)    SpO2 98%    BMI 38.73 kg/m    Subjective:    Patient ID: Wendy Li, female    DOB: 06-30-1965, 55 y.o.   MRN: [id number]  HPI: Wendy Li is a 55 y.o. female  Chief Complaint  Patient presents with   Form Completion    Pt here to have a form completed for her job   Patient presents to clinic to have a form filled out for patient's employer.  She needs vaccines and titers in office today.    Denies HA, CP, SOB, dizziness, palpitations, visual changes, and lower extremity swelling.    Relevant past medical, surgical, family and social history reviewed and updated as indicated. Interim medical history since our last visit reviewed. Allergies and medications reviewed and updated.  Review of Systems  Eyes:  Negative for visual disturbance.  Respiratory:  Negative for cough, chest tightness and shortness of breath.   Cardiovascular:  Negative for chest pain, palpitations and leg swelling.  Neurological:  Negative for dizziness and headaches.   Per HPI unless specifically indicated above     Objective:    BP 121/78    Pulse 77    Temp 98.8 F (37.1 C) (Oral)    Ht 5' 2.3" (1.582 m)    Wt 213 lb 12.8 oz (97 kg)    SpO2 98%    BMI 38.73 kg/m   Wt Readings from Last 3 Encounters:  03/11/21 213 lb 12.8 oz (97 kg)  12/31/20 210 lb 9.6 oz (95.5 kg)  12/02/20 210 lb 3.2 oz (95.3 kg)    Physical Exam Vitals and nursing note reviewed.  Constitutional:      General: She is not in acute distress.    Appearance: Normal appearance. She is normal weight. She is not ill-appearing, toxic-appearing or diaphoretic.  HENT:     Head: Normocephalic.     Right Ear: External ear normal.     Left Ear: External ear normal.     Nose: Nose normal.     Mouth/Throat:     Mouth: Mucous membranes are moist.     Pharynx: Oropharynx is clear.  Eyes:     General:         Right eye: No discharge.        Left eye: No discharge.     Extraocular Movements: Extraocular movements intact.     Conjunctiva/sclera: Conjunctivae normal.     Pupils: Pupils are equal, round, and reactive to light.  Cardiovascular:     Rate and Rhythm: Normal rate and regular rhythm.     Heart sounds: No murmur heard. Pulmonary:     Effort: Pulmonary effort is normal. No respiratory distress.     Breath sounds: Normal breath sounds. No wheezing or rales.  Musculoskeletal:     Cervical back: Normal range of motion and neck supple.  Skin:    General: Skin is warm and dry.     Capillary Refill: Capillary refill takes less than 2 seconds.  Neurological:     General: No focal deficit present.     Mental Status: She is alert and oriented to person, place, and time. Mental status is at baseline.  Psychiatric:        Mood and Affect: Mood normal.  Behavior: Behavior normal.        Thought Content: Thought content normal.        Judgment: Judgment normal.    Results for orders placed or performed in visit on 12/31/20  CBC with Differential/Platelet  Result Value Ref Range   WBC 7.2 3.4 - 10.8 x10E3/uL   RBC 5.14 3.77 - 5.28 x10E6/uL   Hemoglobin 14.3 11.1 - 15.9 g/dL   Hematocrit 44.0 34.0 - 46.6 %   MCV 86 79 - 97 fL   MCH 27.8 26.6 - 33.0 pg   MCHC 32.5 31.5 - 35.7 g/dL   RDW 13.1 11.7 - 15.4 %   Platelets 329 150 - 450 x10E3/uL   Neutrophils 57 Not Estab. %   Lymphs 34 Not Estab. %   Monocytes 8 Not Estab. %   Eos 1 Not Estab. %   Basos 0 Not Estab. %   Neutrophils Absolute 4.0 1.4 - 7.0 x10E3/uL   Lymphocytes Absolute 2.5 0.7 - 3.1 x10E3/uL   Monocytes Absolute 0.6 0.1 - 0.9 x10E3/uL   EOS (ABSOLUTE) 0.1 0.0 - 0.4 x10E3/uL   Basophils Absolute 0.0 0.0 - 0.2 x10E3/uL   Immature Granulocytes 0 Not Estab. %   Immature Grans (Abs) 0.0 0.0 - 0.1 x10E3/uL  Comprehensive metabolic panel  Result Value Ref Range   Glucose 83 70 - 99 mg/dL   BUN 15 6 - 24 mg/dL    Creatinine, Ser 0.76 0.57 - 1.00 mg/dL   eGFR 92 >59 mL/min/1.73   BUN/Creatinine Ratio 20 9 - 23   Sodium 143 134 - 144 mmol/L   Potassium 4.1 3.5 - 5.2 mmol/L   Chloride 101 96 - 106 mmol/L   CO2 25 20 - 29 mmol/L   Calcium 9.5 8.7 - 10.2 mg/dL   Total Protein 7.2 6.0 - 8.5 g/dL   Albumin 4.7 3.8 - 4.9 g/dL   Globulin, Total 2.5 1.5 - 4.5 g/dL   Albumin/Globulin Ratio 1.9 1.2 - 2.2   Bilirubin Total 0.3 0.0 - 1.2 mg/dL   Alkaline Phosphatase 83 44 - 121 IU/L   AST 20 0 - 40 IU/L   ALT 17 0 - 32 IU/L  Lipid panel  Result Value Ref Range   Cholesterol, Total 174 100 - 199 mg/dL   Triglycerides 92 0 - 149 mg/dL   HDL 70 >39 mg/dL   VLDL Cholesterol Cal 17 5 - 40 mg/dL   LDL Chol Calc (NIH) 87 0 - 99 mg/dL   Chol/HDL Ratio 2.5 0.0 - 4.4 ratio  TSH  Result Value Ref Range   TSH 1.120 0.450 - 4.500 uIU/mL  Urinalysis, Routine w reflex microscopic  Result Value Ref Range   Specific Gravity, UA 1.025 1.005 - 1.030   pH, UA 5.0 5.0 - 7.5   Color, UA Yellow Yellow   Appearance Ur Clear Clear   Leukocytes,UA Negative Negative   Protein,UA Negative Negative/Trace   Glucose, UA Negative Negative   Ketones, UA Negative Negative   RBC, UA Negative Negative   Bilirubin, UA Negative Negative   Urobilinogen, Ur 0.2 0.2 - 1.0 mg/dL   Nitrite, UA Negative Negative  B12  Result Value Ref Range   Vitamin B-12 712 232 - 1,245 pg/mL  Vitamin D (25 hydroxy)  Result Value Ref Range   Vit D, 25-Hydroxy 72.1 30.0 - 100.0 ng/mL  Hepatitis C Antibody  Result Value Ref Range   Hep C Virus Ab <0.1 0.0 - 0.9 s/co ratio  Cytology - PAP  Result Value Ref Range   Adequacy      Satisfactory for evaluation; transformation zone component PRESENT.   Diagnosis      - Negative for intraepithelial lesion or malignancy (NILM)      Assessment & Plan:   Problem List Items Addressed This Visit   None Visit Diagnoses     Physical exam, pre-employment    -  Primary   Form filled out for patient  during visit. Titers drawn and vaccines given.   Relevant Orders   Measles/Mumps/Rubella Immunity   Hepatitis B surface antigen   Hepatitis B Surface AntiBODY   Hepatitis B Core Antibody, IgM   Varicella Zoster Abs, IgG/IgM   Need for influenza vaccination       Relevant Orders   Flu Vaccine QUAD 10moIM (Fluarix, Fluzone & Alfiuria Quad PF) (Completed)   Need for Tdap vaccination       Relevant Orders   Tdap vaccine greater than or equal to 7yo IM (Completed)        Follow up plan: Return if symptoms worsen or fail to improve.

## 2021-03-11 ENCOUNTER — Encounter: Payer: Self-pay | Admitting: Nurse Practitioner

## 2021-03-11 ENCOUNTER — Other Ambulatory Visit: Payer: Self-pay

## 2021-03-11 ENCOUNTER — Ambulatory Visit: Payer: Managed Care, Other (non HMO) | Admitting: Nurse Practitioner

## 2021-03-11 VITALS — BP 121/78 | HR 77 | Temp 98.8°F | Ht 62.3 in | Wt 213.8 lb

## 2021-03-11 DIAGNOSIS — Z021 Encounter for pre-employment examination: Secondary | ICD-10-CM | POA: Diagnosis not present

## 2021-03-11 DIAGNOSIS — Z23 Encounter for immunization: Secondary | ICD-10-CM | POA: Diagnosis not present

## 2021-03-12 NOTE — Progress Notes (Signed)
Please let patient know that her lab results shows that she has immunity to Hep B, MMR and varicella. Please print out her lab results so she can come pick them up.

## 2021-03-13 LAB — MEASLES/MUMPS/RUBELLA IMMUNITY
MUMPS ABS, IGG: 215 AU/mL (ref >10.9)
RUBEOLA AB, IGG: >300 AU/mL (ref >16.4)
Rubella Antibodies, IGG: 3.19 index (ref >0.99)

## 2021-03-13 LAB — HEPATITIS B SURFACE ANTIGEN: Hepatitis B Surface Ag: NEGATIVE

## 2021-03-13 LAB — HEPATITIS B CORE ANTIBODY, IGM: Hep B C IgM: NEGATIVE

## 2021-03-13 LAB — HEPATITIS B SURFACE ANTIBODY,QUALITATIVE: Hep B Surface Ab, Qual: REACTIVE

## 2021-03-13 LAB — VARICELLA ZOSTER ABS, IGG/IGM
Varicella IgM: <0.91 index (ref 0.00–0.90)
Varicella zoster IgG: 2058 index (ref >165)

## 2021-06-12 ENCOUNTER — Other Ambulatory Visit: Payer: Self-pay | Admitting: Nurse Practitioner

## 2021-06-12 DIAGNOSIS — Z1231 Encounter for screening mammogram for malignant neoplasm of breast: Secondary | ICD-10-CM

## 2021-07-01 ENCOUNTER — Ambulatory Visit: Payer: Managed Care, Other (non HMO) | Admitting: Nurse Practitioner

## 2021-10-21 ENCOUNTER — Encounter: Payer: Self-pay | Admitting: Nurse Practitioner

## 2021-10-27 ENCOUNTER — Ambulatory Visit
Admission: RE | Admit: 2021-10-27 | Discharge: 2021-10-27 | Disposition: A | Discharge status: Home / Self Care | Payer: 59 | Source: Ambulatory Visit | Attending: Nurse Practitioner | Admitting: Nurse Practitioner

## 2021-10-27 DIAGNOSIS — Z1231 Encounter for screening mammogram for malignant neoplasm of breast: Secondary | ICD-10-CM | POA: Insufficient documentation

## 2021-10-27 NOTE — Progress Notes (Signed)
Please let patient know her Mammogram did not show any evidence of a malignancy.  The recommendation is to repeat the Mammogram in 1 year.  

## 2022-04-07 ENCOUNTER — Encounter: Payer: Self-pay | Admitting: Nurse Practitioner

## 2022-08-01 IMAGING — MG MM DIGITAL SCREENING BILAT W/ TOMO AND CAD
8 series · 8 of 24 positions shown · non-contrast
Comparison: Previous exam(s).

CLINICAL DATA: Screening.

EXAM:
DIGITAL SCREENING BILATERAL MAMMOGRAM WITH TOMO AND CAD

[R CC synth-2D]
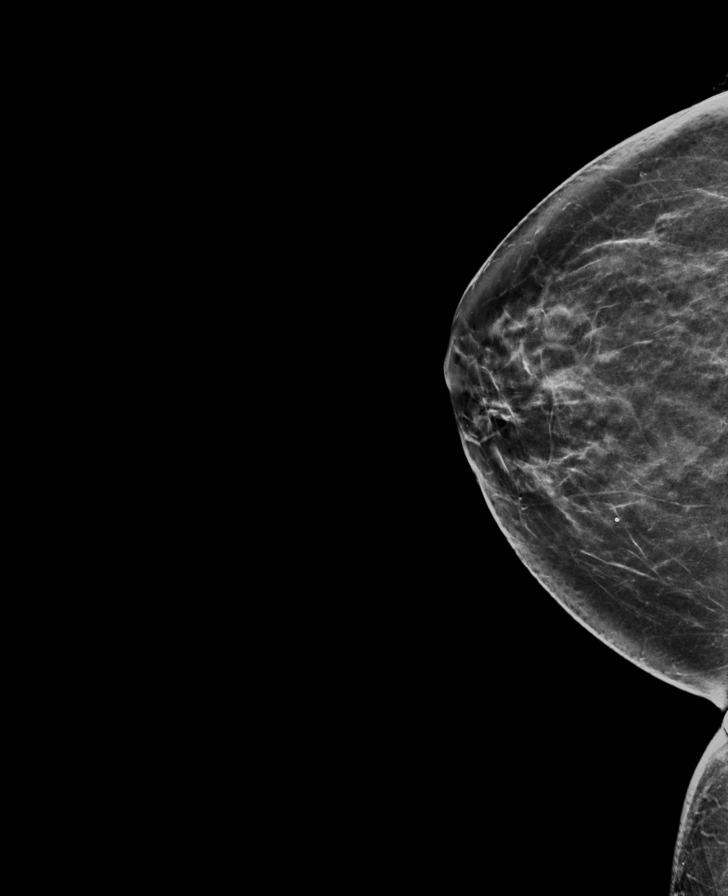

[L MLO synth-2D]
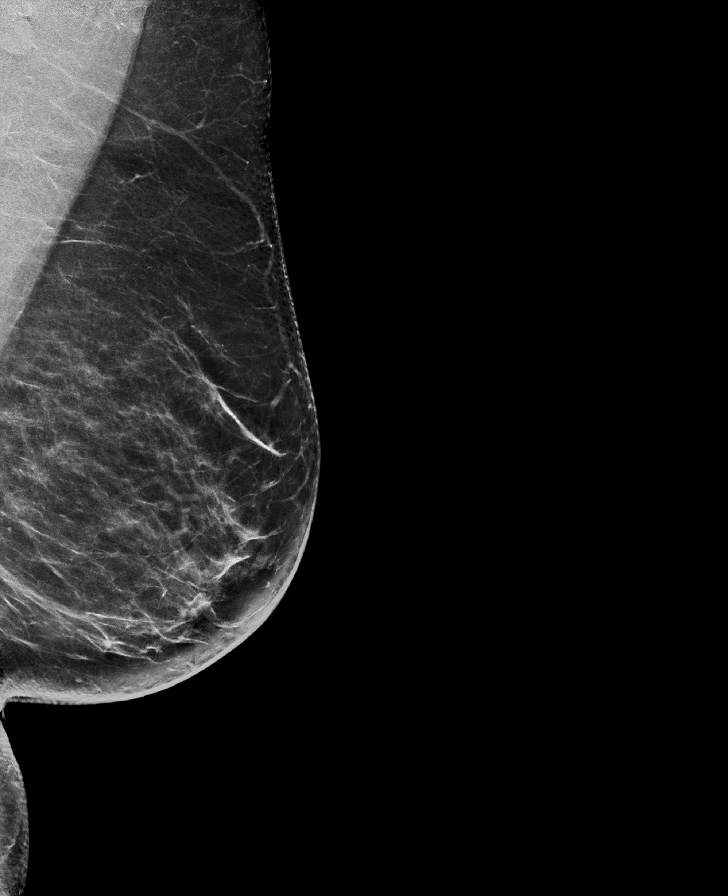

[R MLO synth-2D]
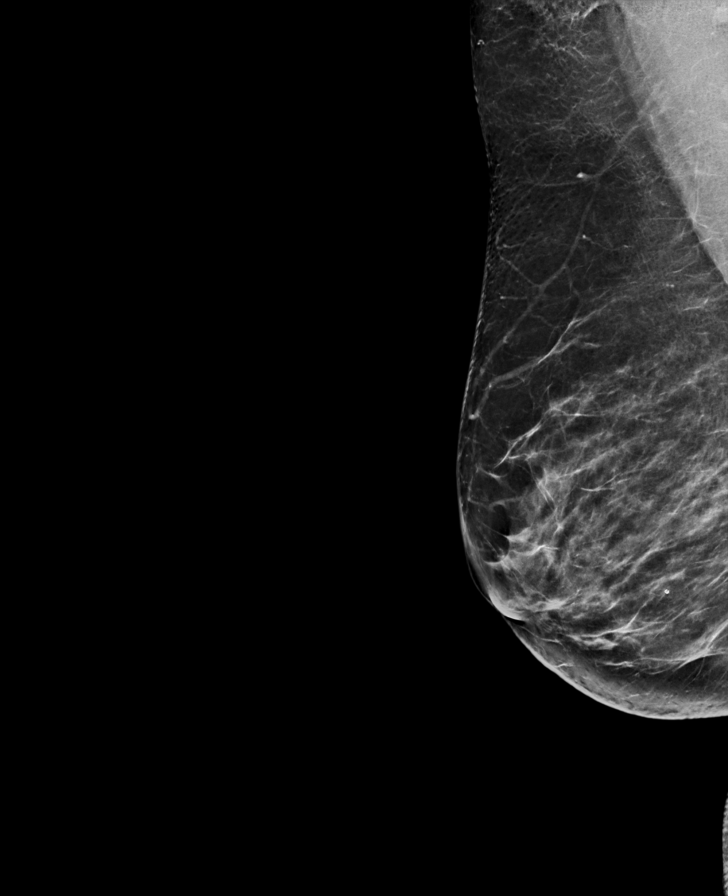

[L CC synth-2D]
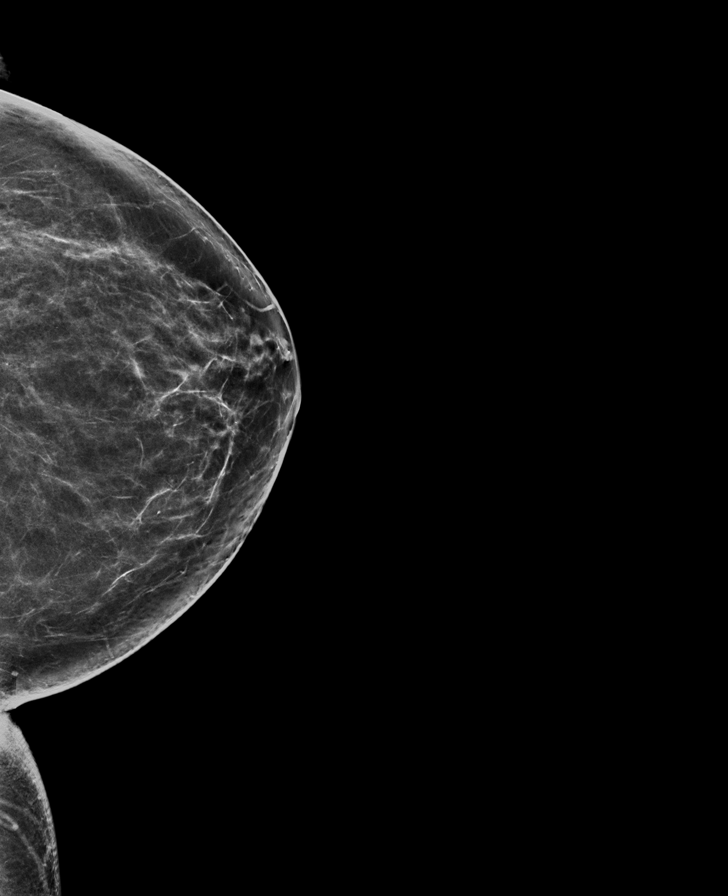

[L MLO tomo · tomo slice 41/82.0]
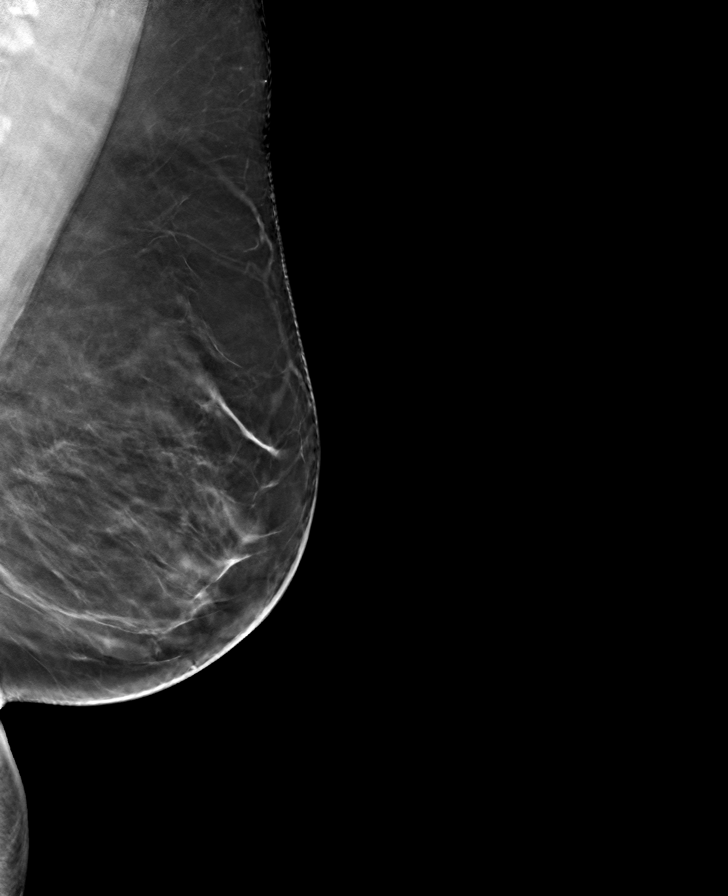

[L CC tomo · tomo slice 39/77.0]
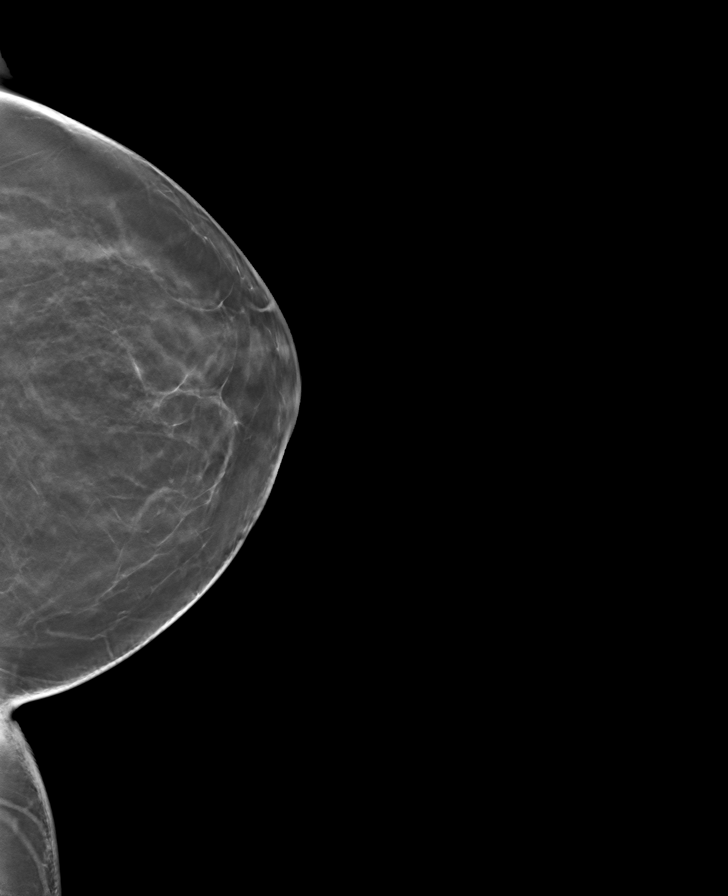

[R MLO tomo · tomo slice 39/76.0]
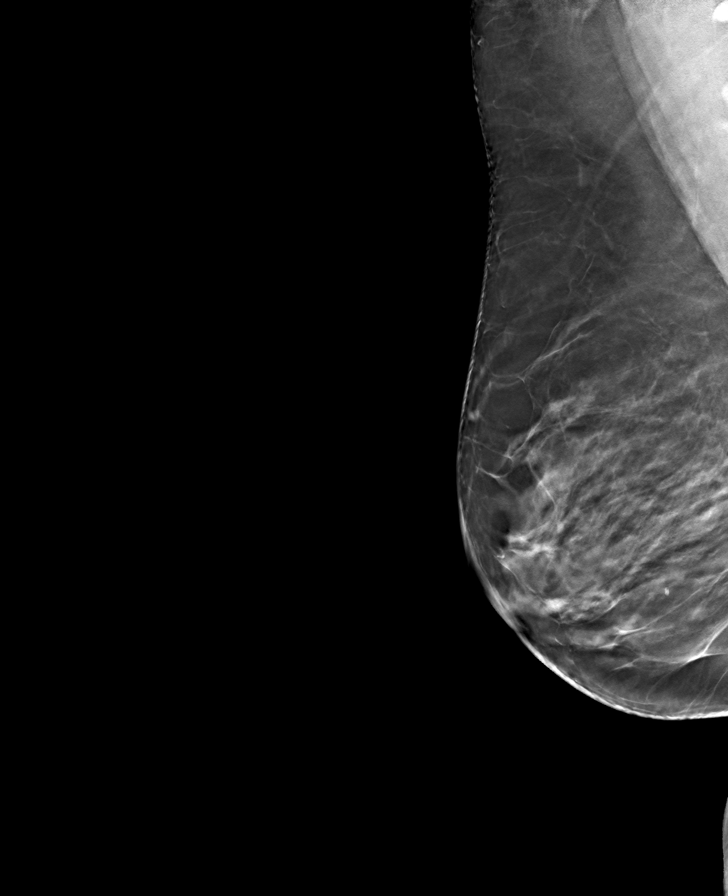

[R CC tomo · tomo slice 37/74.0]
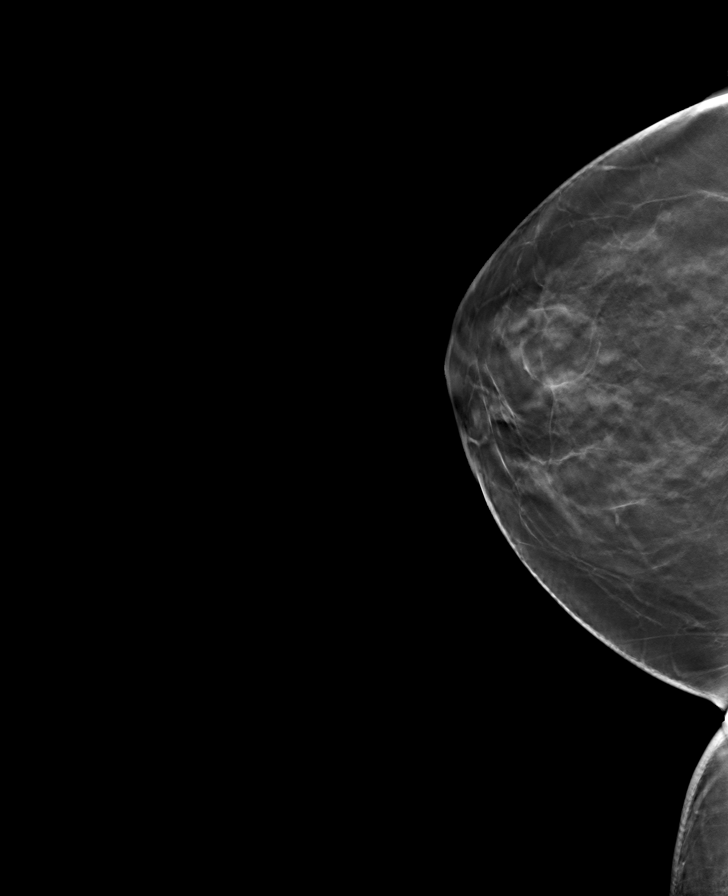

[8 of 24 positions shown; findings below may reference images not displayed]

ACR Breast Density Category b: There are scattered areas of
fibroglandular density.
FINDINGS: There are no findings suspicious for malignancy. The images were
evaluated with computer-aided detection.
IMPRESSION: No mammographic evidence of malignancy. A result letter of this
screening mammogram will be mailed directly to the patient.

RECOMMENDATION:
Screening mammogram in one year. (Code:ZP-7-VX7)

BI-RADS CATEGORY  1: Negative.
